# Patient Record
Sex: Female | Born: 1995 | Race: Black or African American | Hispanic: No | Marital: Single | Smoking: Never smoker
Health system: Southern US, Community
[De-identification: ages and names within clinical notes are randomized; demographics above are authoritative.]

## PROBLEM LIST (undated history)

## (undated) ENCOUNTER — Inpatient Hospital Stay (HOSPITAL_COMMUNITY): Payer: Self-pay

## (undated) DIAGNOSIS — D649 Anemia, unspecified: Secondary | ICD-10-CM

## (undated) HISTORY — PX: NO PAST SURGERIES: SHX2092

---

## 2013-04-22 ENCOUNTER — Encounter (HOSPITAL_COMMUNITY): Payer: Self-pay | Admitting: *Deleted

## 2013-04-22 ENCOUNTER — Emergency Department (HOSPITAL_COMMUNITY)
Admission: EM | Admit: 2013-04-22 | Discharge: 2013-04-22 | Disposition: A | Discharge status: Home / Self Care | Payer: Self-pay | Attending: Emergency Medicine | Admitting: Emergency Medicine

## 2013-04-22 ENCOUNTER — Encounter (HOSPITAL_COMMUNITY): Payer: Self-pay | Admitting: Emergency Medicine

## 2013-04-22 DIAGNOSIS — R200 Anesthesia of skin: Secondary | ICD-10-CM

## 2013-04-22 DIAGNOSIS — R5381 Other malaise: Secondary | ICD-10-CM | POA: Insufficient documentation

## 2013-04-22 DIAGNOSIS — F411 Generalized anxiety disorder: Secondary | ICD-10-CM | POA: Insufficient documentation

## 2013-04-22 DIAGNOSIS — Z79899 Other long term (current) drug therapy: Secondary | ICD-10-CM | POA: Insufficient documentation

## 2013-04-22 DIAGNOSIS — L819 Disorder of pigmentation, unspecified: Secondary | ICD-10-CM | POA: Insufficient documentation

## 2013-04-22 DIAGNOSIS — E669 Obesity, unspecified: Secondary | ICD-10-CM | POA: Insufficient documentation

## 2013-04-22 DIAGNOSIS — D649 Anemia, unspecified: Secondary | ICD-10-CM | POA: Insufficient documentation

## 2013-04-22 DIAGNOSIS — R0682 Tachypnea, not elsewhere classified: Secondary | ICD-10-CM | POA: Insufficient documentation

## 2013-04-22 DIAGNOSIS — F419 Anxiety disorder, unspecified: Secondary | ICD-10-CM

## 2013-04-22 DIAGNOSIS — I7389 Other specified peripheral vascular diseases: Secondary | ICD-10-CM | POA: Insufficient documentation

## 2013-04-22 DIAGNOSIS — R209 Unspecified disturbances of skin sensation: Secondary | ICD-10-CM | POA: Insufficient documentation

## 2013-04-22 DIAGNOSIS — M7989 Other specified soft tissue disorders: Secondary | ICD-10-CM | POA: Insufficient documentation

## 2013-04-22 DIAGNOSIS — R42 Dizziness and giddiness: Secondary | ICD-10-CM | POA: Insufficient documentation

## 2013-04-22 DIAGNOSIS — R51 Headache: Secondary | ICD-10-CM | POA: Insufficient documentation

## 2013-04-22 HISTORY — DX: Anemia, unspecified: D64.9

## 2013-04-22 LAB — POCT I-STAT, CHEM 8
Calcium, Ion: 1.18 mmol/L (ref 1.12–1.23)
Glucose, Bld: 102 mg/dL — ABNORMAL HIGH (ref 70–99)
HCT: 37 % (ref 36.0–46.0)
Hemoglobin: 12.6 g/dL (ref 12.0–15.0)

## 2013-04-22 MED ORDER — LORAZEPAM 1 MG PO TABS: 1.0000 mg | ORAL_TABLET | Freq: Four times a day (QID) | ORAL | Status: DC | PRN

## 2013-04-22 MED ORDER — LORAZEPAM 2 MG/ML IJ SOLN
1.0000 mg | Freq: Once | INTRAMUSCULAR | Status: AC
  Administered 2013-04-22: 1 mg via INTRAMUSCULAR
  Filled 2013-04-22: qty 1

## 2013-04-22 NOTE — ED Notes (Signed)
PT. REPORTS RIGHT HAND Nathaniel Man PAIN /NUMBNESS ONSET THIS EVENING , DENIES INJURY .

## 2013-04-22 NOTE — ED Notes (Signed)
Pt in c/o bilateral hand pain, states she was seen here for same last night and dx with an anxiety attack, states she was walking to class and started to feel her heart beating in her arms and her hand is clamped down and her hands were turning blue, describes and tingling feeling, no distress noted.

## 2013-04-22 NOTE — ED Notes (Signed)
Pt reports bilateral swelling, tingling, numbness in both hands since yesterday. Reports coming to the ER last night for the same complaint and was diagnosed with anxiety but does not think such is the case. States that she has migraines and did have a headache with the numbness and tingling. Came back to the ER for further evaluation.

## 2013-04-22 NOTE — ED Provider Notes (Signed)
17 year old female comes in with numbness of her right arm which occurred after she came out of the shower this evening., She is tearful and appears to be hyperventilating. She has decreased sensation over the right thumb, second finger, third finger, fifth finger. She will make a pincer grasp with slight weakness but she will not flex her third, fourth, fifth fingers. Tinel sign is negative. She has good capillary refill and strong pulses. It is possible that she has carpal tunnel syndrome but I think more likely she is hyperventilating. She will be given a dose of lorazepam and reassessed.  Medical screening examination/treatment/procedure(s) were conducted as a shared visit with non-physician practitioner(s) and myself.  I personally evaluated the patient during the encounter   Dione Booze, MD 04/22/13 [phone]

## 2013-04-22 NOTE — ED Provider Notes (Signed)
CSN: [id number]     Arrival date & time 04/22/13  1501 History  This chart was scribed for Dierdre Forth, PA working with Shon Baton, MD by Quintella Reichert, ED Scribe. This patient was seen in room TR06C/TR06C and the patient's care was started at 3:43 PM.    Chief Complaint  Patient presents with  . Hand Pain    The history is provided by the patient. No language interpreter was used.    HPI Comments: Lisa Garza is a 17 y.o. female with h/o anemia who presents to the Emergency Department complaining of several hours of bilateral hand and arm numbness, with associated swelling, blue discoloration to bilateral palms, and posterior headache.  Pt states that she was in class when her right hand suddenly became numb and began swelling and her palm turned blue.  She then developed numbness and swelling to her entire right arm.  These same symptoms then occurred to her left hand and then her left arm.  She also complains of pain to the arms and hands described as tightness and states "I could feel my heart beating through my arms."  She denies CP or SOB.  Presently she states her arm swelling has resolved but her hands are still somewhat swollen and her palms are blue.  Pt was seen in the ED yesterday for an episode of the same symptoms and informed that her symptoms may be associated with hyperventilation due to anxiety.  She was given a dose of lorazepam and states that afterwards all of her symptoms returned to normal, including her palm discoloration.  She denies similar episodes prior to yesterday.  She denies any recent increased stress.  She states she is eating normally.  She denies any allergies to her knowledge.  Pt notes that she received a tattoo 3 days ago but she did not have any issues when it was placed, and she denies pain or itching to the tattoo.  Her friend also received a tattoo from the same shop on the same day and has not had any similar symptoms.  She reports a  past medical history of anemia requiring blood transfusion after her hemoglobin dropped down to 4 due to heavy menstrual bleeding.  She denies experiencing similar symptoms at that time.  Pt has no PCP   Past Medical History  Diagnosis Date  . Anemia     History reviewed. No pertinent past surgical history.   History reviewed. No pertinent family history.   History  Substance Use Topics  . Smoking status: Never Smoker   . Smokeless tobacco: Not on file  . Alcohol Use: No    OB History   Grav Para Term Preterm Abortions TAB SAB Ect Mult Living                   Review of Systems  Respiratory: Negative for shortness of breath.   Cardiovascular: Negative for chest pain.  Musculoskeletal:       Bilateral arm and hand swelling  Skin: Positive for color change.  Neurological: Positive for numbness and headaches.  All other systems reviewed and are negative.      Allergies  Review of patient's allergies indicates no known allergies.  Home Medications   Current Outpatient Rx  Name  Route  Sig  Dispense  Refill  . IRON PO   Oral   Take 1 tablet by mouth daily.         Marland Kitchen LORazepam (ATIVAN) 1 MG tablet  Oral   Take 1 tablet (1 mg total) by mouth every 6 (six) hours as needed for anxiety.   10 tablet   0    BP 153/92  Pulse 88  Temp(Src) 98.4 F (36.9 C) (Oral)  Resp 20  Wt 200 lb (90.719 kg)  BMI 30.42 kg/m2  SpO2 100%  LMP 04/18/2013  Physical Exam  Nursing note and vitals reviewed. Constitutional: She appears well-developed and well-nourished. No distress.  Awake, alert, nontoxic appearance  HENT:  Head: Normocephalic and atraumatic.  Mouth/Throat: Oropharynx is clear and moist. No oropharyngeal exudate.  Eyes: Conjunctivae are normal. No scleral icterus.  Neck: Normal range of motion. Neck supple.  Cardiovascular: Normal rate, regular rhythm, normal heart sounds and intact distal pulses.   No murmur heard. Capillary refill <3 seconds to  palms of hands Capillary refill < 3 seconds to soles of feet  Pulmonary/Chest: Effort normal and breath sounds normal. No stridor. Not tachypneic. No respiratory distress. She has no wheezes. She has no rales.  Breath sounds clear and equal  Abdominal: Soft. Bowel sounds are normal. She exhibits no mass. There is no tenderness. There is no rebound and no guarding.  Musculoskeletal: Normal range of motion. She exhibits no edema.       Right hand: She exhibits swelling (mild, generalized). She exhibits normal range of motion, no tenderness, no bony tenderness, normal two-point discrimination, normal capillary refill, no deformity and no laceration.       Left hand: She exhibits normal range of motion, no tenderness, no bony tenderness, normal two-point discrimination, normal capillary refill, no deformity, no laceration and no swelling. Normal sensation noted.       Right foot: She exhibits normal capillary refill.       Left foot: She exhibits normal capillary refill.  Neurological: She is alert.  Speech is clear and goal oriented, follows commands Major Cranial nerves without deficit, no facial droop Normal strength in upper and lower extremities bilaterally including dorsiflexion and plantar flexion, strong and equal grip strength Sensation normal to light and sharp touch Moves extremities without ataxia, coordination intact Normal finger to nose and rapid alternating movements Neg romberg, no pronator drift Normal gait and balance  Skin: Skin is warm and dry. She is not diaphoretic. There is cyanosis (bilateral palms). No erythema.  Palms of hand cyanotic Soles of feet are pink  Psychiatric: She has a normal mood and affect.    ED Course  Procedures (including critical care time)  DIAGNOSTIC STUDIES: Oxygen Saturation is 100% on room air, normal by my interpretation.    COORDINATION OF CARE: 3:49 PM-Discussed treatment plan which includes evaluation by attending physician with pt at  bedside and pt agreed to plan.   5:19 PM: Informed pt that symptoms are consistent with acrocyanosis.  Discussed treatment plan which includes referral to PCP for further evaluation and management with pt at bedside and pt agreed to plan.  Labs Review Labs Reviewed - No data to display  Imaging Review No results found.  MDM   1. Acrocyanosis    Lisa Garza presents with mild swelling and cyanosis the palms of both hands. She's without central cyanosis, shortness of breath, wheezing, hyperventilation or tachycardia.  Her oxygen saturation measured at 100% in spite of the cyanosis.  Patient history of anemia but i-STAT chemistry taken at 1 AM this morning at her previous visit is normal. She is without electrolyte abnormalities or anemia.  Patient symptoms last night completely resolved with Ativan however last  night it was documented that she was tearful, hyperventilating and tachycardic. Patient has none of the symptoms today with persistent cyanosis.  Patient's feet without cyanosis.  Patient with acrocyanosis of unknown etiology.  Patient alert, oriented, nontoxic, nonseptic appearing.  Does not appear classically to be a Raynaud's phenomenon as her hands are currently warm, nonpainful and there was no cold stimulus; but cannot rule this out.  Patient has no known medical problems and denies drug abuse.  There is no evidence of allergic reaction or cellulitis.  There is also no evidence of peripheral arterial or vascular disease this patient has strong radial pulses and a normal capillary refill.  It has been determined that no acute conditions requiring further emergency intervention are present at this time. The patient/guardian have been advised of the diagnosis and plan. We have discussed signs and symptoms that warrant return to the ED, such as changes or worsening in symptoms.   Vital signs are stable at discharge.   BP 153/92  Pulse 88  Temp(Src) 98.4 F (36.9 C) (Oral)  Resp  20  Wt 200 lb (90.719 kg)  BMI 30.42 kg/m2  SpO2 100%  LMP 04/18/2013  Patient/guardian has voiced understanding and agreed to follow-up with the PCP or specialist.  Dr. Wilkie Aye was consulted, evaluated this patient with me and agrees with the plan.    I personally performed the services described in this documentation, which was scribed in my presence. The recorded information has been reviewed and is accurate.    Dahlia Client Ezana Hubbert, PA-C 04/22/13 2341  Dierdre Forth, PA-C 04/22/13 [phone]

## 2013-04-22 NOTE — ED Provider Notes (Signed)
CSN: [id number]     Arrival date & time 04/22/13  0034 History   First MD Initiated Contact with Patient 04/22/13 0105     Chief Complaint  Patient presents with  . Hand Pain   (Consider location/radiation/quality/duration/timing/severity/associated sxs/prior Treatment) HPI Comments: Patient is a 17 year old female who presents today with Sudden onset of right hand numbness. She reports that she was walking to the bathroom and on 7 her entire right arm went numb. It is painful. This never happened to her in the past. She reports that since that time she has become lightheaded. She reports that her arms "tingling" and refuses to move her fingers. She reports past medical history of anemia requiring blood transfusion. She denies any illicit drug use or alcohol consumption. No fevers, chills, nausea, vomiting. She states she was feeling well before this occurred.  The history is provided by the patient. No language interpreter was used.    Past Medical History  Diagnosis Date  . Anemia    History reviewed. No pertinent past surgical history. No family history on file. History  Substance Use Topics  . Smoking status: Never Smoker   . Smokeless tobacco: Not on file  . Alcohol Use: No   OB History   Grav Para Term Preterm Abortions TAB SAB Ect Mult Living                 Review of Systems  Constitutional: Negative for fever and chills.  Respiratory: Negative for shortness of breath.   Neurological: Positive for weakness and numbness.  All other systems reviewed and are negative.    Allergies  Review of patient's allergies indicates no known allergies.  Home Medications   Current Outpatient Rx  Name  Route  Sig  Dispense  Refill  . IRON PO   Oral   Take 1 tablet by mouth daily.          BP 147/78  Pulse 105  Temp(Src) 98.3 F (36.8 C) (Oral)  Ht 5\' 8"  (1.727 m)  Wt 200 lb (90.719 kg)  BMI 30.42 kg/m2  SpO2 97%  LMP 04/18/2013 Physical Exam  Nursing note and  vitals reviewed. Constitutional: She is oriented to person, place, and time. She appears well-developed and well-nourished. No distress.  obese  HENT:  Head: Normocephalic and atraumatic.  Right Ear: External ear normal.  Left Ear: External ear normal.  Nose: Nose normal.  Mouth/Throat: Oropharynx is clear and moist.  Eyes: Conjunctivae are normal.  Neck: Normal range of motion.  Cardiovascular: Normal rate, regular rhythm and normal heart sounds.   Pulmonary/Chest: Breath sounds normal. No stridor. Tachypnea noted. No respiratory distress. She has no wheezes. She has no rales.  Patient is hyperventilating   Abdominal: Soft. She exhibits no distension.  Musculoskeletal: Normal range of motion.  Neurological: She is alert and oriented to person, place, and time. She has normal strength.  Sharp-dull sensation intact on right arm. On right palm patient reports no sensation. Grip strength decreased in right hand. Strength normal in triceps/biceps/shoulders when tested against resistance.   Skin: Skin is warm and dry. She is not diaphoretic. No erythema.  Psychiatric: Her behavior is normal. Her mood appears anxious.  tearful    ED Course  Procedures (including critical care time) Labs Review Labs Reviewed  POCT I-STAT, CHEM 8 - Abnormal; Notable for the following:    Glucose, Bld 102 (*)    All other components within normal limits   Imaging Review No results  found.  MDM   1. Anxiety   2. Numbness and tingling in hands     Patient presents with sudden onset of tingling in hands. Given Ativan IM. Patient re-evaluated and numbness improved, but patient complaining of headache. She continues to repeat she wants to go home. HA is non concerning for [facility], ICH, Meningitis, or temporal arteritis. I believe her symptoms are all stemming from her anxiety. Chem 8 is unremarkable. No anemia. Dr. Preston Fleeting evaluated patient and agrees with plan.     Mora Bellman, PA-C 04/22/13 [phone]

## 2013-04-22 NOTE — ED Provider Notes (Signed)
Medical screening examination/treatment/procedure(s) were conducted as a shared visit with non-physician practitioner(s) and myself.  I personally evaluated the patient during the encounter   This a 17 year old female who presents with bilateral hand pain, swelling, and discoloration. Patient was seen last night for similar complaints but in the setting of acute anxiety. Her symptoms resolved and she was discharged home. She had return of pain, tingling and bilateral hand discoloration. She denies any other symptoms at this time. Examination is notable for acrocyanosis of the bilateral hands. Refill is less than 3 seconds. Hands are warm, sensation and movement are intact. Patient denies any white discoloration of hands prior to the blue discoloration. She is not anxious appearing and is not hyperventilating at this time. Review of her labs done last night including calcium are normal. At this time unsure what is causing her acrocyanosis. She could be developing Raynaud's phenomenon. She will be discharged home and set up for PCP followup for further management.  Shon Baton, MD 04/22/13 [phone]

## 2013-04-23 NOTE — ED Provider Notes (Signed)
Medical screening examination/treatment/procedure(s) were conducted as a shared visit with non-physician practitioner(s) and myself.  I personally evaluated the patient during the encounter  Shon Baton, MD 04/23/13 1313

## 2014-07-24 ENCOUNTER — Emergency Department (HOSPITAL_COMMUNITY)
Admission: EM | Admit: 2014-07-24 | Discharge: 2014-07-24 | Disposition: A | Discharge status: Home / Self Care | Payer: Self-pay | Attending: Emergency Medicine | Admitting: Emergency Medicine

## 2014-07-24 ENCOUNTER — Encounter (HOSPITAL_COMMUNITY): Payer: Self-pay | Admitting: *Deleted

## 2014-07-24 DIAGNOSIS — Z91040 Latex allergy status: Secondary | ICD-10-CM | POA: Insufficient documentation

## 2014-07-24 DIAGNOSIS — Z79899 Other long term (current) drug therapy: Secondary | ICD-10-CM | POA: Insufficient documentation

## 2014-07-24 DIAGNOSIS — Z789 Other specified health status: Secondary | ICD-10-CM

## 2014-07-24 DIAGNOSIS — D649 Anemia, unspecified: Secondary | ICD-10-CM | POA: Insufficient documentation

## 2014-07-24 DIAGNOSIS — N644 Mastodynia: Secondary | ICD-10-CM

## 2014-07-24 DIAGNOSIS — M795 Residual foreign body in soft tissue: Secondary | ICD-10-CM | POA: Insufficient documentation

## 2014-07-24 DIAGNOSIS — N649 Disorder of breast, unspecified: Secondary | ICD-10-CM | POA: Insufficient documentation

## 2014-07-24 MED ORDER — CEPHALEXIN 500 MG PO CAPS
500.0000 mg | ORAL_CAPSULE | Freq: Four times a day (QID) | ORAL | Status: DC
Start: 2014-07-24 — End: 2014-07-24

## 2014-07-24 MED ORDER — LORAZEPAM 1 MG PO TABS: 1.0000 mg | ORAL_TABLET | Freq: Four times a day (QID) | ORAL | Status: DC | PRN

## 2014-07-24 MED ORDER — ACETAMINOPHEN-CODEINE #3 300-30 MG PO TABS: 1.0000 | ORAL_TABLET | ORAL | Status: DC | PRN

## 2014-07-24 MED ORDER — CEPHALEXIN 500 MG PO CAPS: 500.0000 mg | ORAL_CAPSULE | Freq: Four times a day (QID) | ORAL | Status: DC

## 2014-07-24 NOTE — ED Notes (Signed)
Declined W/C at D/C and was escorted to lobby by RN. 

## 2014-07-24 NOTE — ED Notes (Signed)
Pt in c/o foreign body to bilateral nipples. Pt got them pierced with bars, on each end of the bars there is a ball to hold them in place. On each side, one of the balls has been pulled into her nipple tissue. The other ball of each, remains on the outside. Pt returned to shop where she had this done and they were unable to assist her. Pt c/o pain, denies drainage from areas. No distress noted.

## 2014-07-24 NOTE — Discharge Instructions (Signed)
1. Medications: Tylenol #3, usual home medications 2. Treatment: rest, drink plenty of fluids, warm compresses 3. Follow Up: Please followup with your primary doctor and/or general surgery in 3 days for discussion of your diagnoses and further evaluation after today's visit; if you do not have a primary care doctor use the resource guide provided to find one; Please return to the ER for signs of infection including purulent drainage, redness or increased warmth.

## 2014-07-24 NOTE — ED Provider Notes (Signed)
CSN: [id number]     Arrival date & time 07/24/14  1613 History  This chart was scribed for non-physician practitioner, Dierdre ForthHannah Cherylene Ferrufino, PA-C,working with Mirian MoMatthew Gentry, MD, by Karle PlumberJennifer Tensley, ED Scribe. This patient was seen in room TR09C/TR09C and the patient's care was started at 5:01 PM.  Chief Complaint  Patient presents with  . Foreign Body   HPI  HPI Comments:  Lisa Garza is a 18 y.o. obese female who presents to the Emergency Department complaining of her nipple piercing being stuck in her nipple. She reports she got her nipple pierced more than 6 months ago without any issue but states that yesterday the ball of the piercing went inside the hole. She went to the person that placed the piercing PTA but he could not remove it due to the swelling. She reports associated severe pain and clear drainage from the site. Touching the area makes the pain worse. Denies alleviating factors. She has not taken anything for the pain or done anything to treat the symptoms. Denies fever or chills.  No history of diabetes or immunocompromise.  Past Medical History  Diagnosis Date  . Anemia    History reviewed. No pertinent past surgical history. History reviewed. No pertinent family history. History  Substance Use Topics  . Smoking status: Never Smoker   . Smokeless tobacco: Not on file  . Alcohol Use: No   OB History    No data available     Review of Systems  Constitutional: Negative for fever, chills, diaphoresis, appetite change, fatigue and unexpected weight change.  HENT: Negative for mouth sores.   Eyes: Negative for visual disturbance.  Respiratory: Negative for cough, chest tightness, shortness of breath and wheezing.   Cardiovascular: Negative for chest pain.  Gastrointestinal: Negative for nausea, vomiting, abdominal pain, diarrhea and constipation.  Endocrine: Negative for polydipsia, polyphagia and polyuria.  Genitourinary: Negative for dysuria, urgency, frequency  and hematuria.  Musculoskeletal: Negative for back pain and neck stiffness.  Skin: Positive for wound. Negative for rash.       Foreign body in left nipple  Allergic/Immunologic: Negative for immunocompromised state.  Neurological: Negative for syncope, light-headedness and headaches.  Hematological: Does not bruise/bleed easily.  Psychiatric/Behavioral: Negative for sleep disturbance. The patient is not nervous/anxious.     Allergies  Latex  Home Medications   Prior to Admission medications   Medication Sig Start Date End Date Taking? Authorizing Provider  ferrous sulfate 325 (65 FE) MG tablet Take 325 mg by mouth daily with breakfast. For anemia   Yes Historical Provider, MD  acetaminophen-codeine (TYLENOL #3) 300-30 MG per tablet Take 1 tablet by mouth every 4 (four) hours as needed for moderate pain. 07/24/14   Nikitha Mode, PA-C  cephALEXin (KEFLEX) 500 MG capsule Take 1 capsule (500 mg total) by mouth 4 (four) times daily. 07/24/14   Duvan Mousel, PA-C   Triage Vitals: BP 108/58 mmHg  Pulse 89  Temp(Src) 98.3 F (36.8 C) (Oral)  Resp 18  Ht 5\' 8"  (1.727 m)  Wt 190 lb (86.183 kg)  BMI 28.90 kg/m2  SpO2 95% Physical Exam  Constitutional: She appears well-developed and well-nourished. No distress.  Awake, alert, nontoxic appearance  HENT:  Head: Normocephalic and atraumatic.  Mouth/Throat: Oropharynx is clear and moist. No oropharyngeal exudate.  Eyes: Conjunctivae are normal. No scleral icterus.  Neck: Normal range of motion. Neck supple.  Cardiovascular: Normal rate, regular rhythm and intact distal pulses.   Pulmonary/Chest: Effort normal and breath sounds normal. No respiratory  distress. She has no wheezes.  Equal chest expansion.  Bilateral nipple piercings in place with barbell shaped piercings bilaterally. Left ball has become imbedded in nipple and piercing hole has closed over. Areola and nipple are soft, nonerythematous and without induration or  increased warmth. No purulent drainage noted to either nipple or piercing. No other changes in skin of breasts bilaterally.  Abdominal: Soft. Bowel sounds are normal. She exhibits no mass. There is no tenderness. There is no rebound and no guarding.  Musculoskeletal: Normal range of motion. She exhibits no edema.  Neurological: She is alert.  Speech is clear and goal oriented Moves extremities without ataxia  Skin: Skin is warm and dry. She is not diaphoretic.  Psychiatric: She has a normal mood and affect.  Nursing note and vitals reviewed.   ED Course  Procedures (including critical care time) DIAGNOSTIC STUDIES: Oxygen Saturation is 95% on RA, adequate by my interpretation.   COORDINATION OF CARE: 5:06 PM- Will prescribe pain medication and refer to surgeon. Pt verbalizes understanding and agrees to plan.  Medications - No data to display  Labs Review Labs Reviewed - No data to display  Imaging Review No results found.   EKG Interpretation None      MDM   Final diagnoses:  Body piercing  Nipple pain   Lisa LimeSumarra Stege resents with bilateral nipple pain due to ingrown nipple rings. Breast exam without evidence of masses, erythema, induration appeared on drainage to suggest infection or abscess. Nipple rings will need to be removed however I believe this is better left for a general surgeon.  Patient will be given pain control and Keflex to prevent infection. She is to see her primary care doctor and/or general surgeon within 3 days for further evaluation.  I have personally reviewed patient's vitals, nursing note and any pertinent labs or imaging.  I performed an focused physical exam; undressed when appropriate .    It has been determined that no acute conditions requiring further emergency intervention are present at this time. The patient/guardian have been advised of the diagnosis and plan. I reviewed any labs and imaging including any potential incidental findings. We  have discussed signs and symptoms that warrant return to the ED and they are listed in the discharge instructions.    Vital signs are stable at discharge.   BP 108/58 mmHg  Pulse 89  Temp(Src) 98.3 F (36.8 C) (Oral)  Resp 18  Ht 5\' 8"  (1.727 m)  Wt 190 lb (86.183 kg)  BMI 28.90 kg/m2  SpO2 95%  I personally performed the services described in this documentation, which was scribed in my presence. The recorded information has been reviewed and is accurate.    Lisa ClientHannah Jackee Glasner, PA-C 07/24/14 1805  Mirian MoMatthew Gentry, MD 07/27/14 [phone]

## 2014-09-28 ENCOUNTER — Emergency Department (HOSPITAL_COMMUNITY)
Admission: EM | Admit: 2014-09-28 | Discharge: 2014-09-28 | Disposition: A | Discharge status: Home / Self Care | Payer: PRIVATE HEALTH INSURANCE | Attending: Emergency Medicine | Admitting: Emergency Medicine

## 2014-09-28 ENCOUNTER — Encounter (HOSPITAL_COMMUNITY): Payer: Self-pay | Admitting: Physical Medicine and Rehabilitation

## 2014-09-28 DIAGNOSIS — H109 Unspecified conjunctivitis: Secondary | ICD-10-CM

## 2014-09-28 DIAGNOSIS — Z91040 Latex allergy status: Secondary | ICD-10-CM | POA: Diagnosis not present

## 2014-09-28 DIAGNOSIS — Z79899 Other long term (current) drug therapy: Secondary | ICD-10-CM | POA: Diagnosis not present

## 2014-09-28 DIAGNOSIS — D649 Anemia, unspecified: Secondary | ICD-10-CM | POA: Insufficient documentation

## 2014-09-28 DIAGNOSIS — Z792 Long term (current) use of antibiotics: Secondary | ICD-10-CM | POA: Insufficient documentation

## 2014-09-28 DIAGNOSIS — H5712 Ocular pain, left eye: Secondary | ICD-10-CM | POA: Diagnosis present

## 2014-09-28 MED ORDER — TETRACAINE HCL 0.5 % OP SOLN
1.0000 [drp] | Freq: Once | OPHTHALMIC | Status: AC
  Administered 2014-09-28: 1 [drp] via OPHTHALMIC
  Filled 2014-09-28: qty 2

## 2014-09-28 MED ORDER — FLUORESCEIN SODIUM 1 MG OP STRP
1.0000 | ORAL_STRIP | Freq: Once | OPHTHALMIC | Status: AC
  Administered 2014-09-28: 1 via OPHTHALMIC
  Filled 2014-09-28: qty 1

## 2014-09-28 MED ORDER — POLYMYXIN B-TRIMETHOPRIM 10000-0.1 UNIT/ML-% OP SOLN: 2.0000 [drp] | OPHTHALMIC | Status: DC

## 2014-09-28 NOTE — ED Notes (Signed)
Pt presents to department for evaluation of L eye pain/redness. Ongoing x1 day.

## 2014-09-28 NOTE — Discharge Instructions (Signed)
Use antibiotic eyedrops as directed until symptoms resolve. Refer to attached documents for more information. °

## 2014-09-28 NOTE — ED Provider Notes (Signed)
CSN: [id number]     Arrival date & time 09/28/14  1046 History  This chart was scribed for non-physician practitioner Emilia BeckKaitlyn Fabien Travelstead, working with Lyanne CoKevin M Campos, MD by Carl Bestelina Holson, ED Scribe. This patient was seen in room TR04C/TR04C and the patient's care was started at 11:55 AM.   Chief Complaint  Patient presents with  . Eye Pain   HPI Comments: Lisa Garza is a 19 y.o. female who presents to the Emergency Department complaining of left eye pain for 1 day. The pain is aching and severe without radiation. The pain is in the left eye. No injury or foreign body. Patient wears contacts. She reports associated eye redness and watery discharge. Nothing tried at home.    Patient is a 19 y.o. female presenting with eye pain. The history is provided by the patient. No language interpreter was used.  Eye Pain    Past Medical History  Diagnosis Date  . Anemia    History reviewed. No pertinent past surgical history. No family history on file. History  Substance Use Topics  . Smoking status: Never Smoker   . Smokeless tobacco: Not on file  . Alcohol Use: No   OB History    No data available     Review of Systems  Eyes: Positive for pain and redness.  All other systems reviewed and are negative.     Allergies  Latex  Home Medications   Prior to Admission medications   Medication Sig Start Date End Date Taking? Authorizing Provider  acetaminophen-codeine (TYLENOL #3) 300-30 MG per tablet Take 1 tablet by mouth every 4 (four) hours as needed for moderate pain. 07/24/14   Hannah Muthersbaugh, PA-C  cephALEXin (KEFLEX) 500 MG capsule Take 1 capsule (500 mg total) by mouth 4 (four) times daily. 07/24/14   Hannah Muthersbaugh, PA-C  ferrous sulfate 325 (65 FE) MG tablet Take 325 mg by mouth daily with breakfast. For anemia    Historical Provider, MD   BP 125/69 mmHg  Pulse 66  Temp(Src) 97.6 F (36.4 C) (Oral)  Resp 18  SpO2 100% Physical Exam  Constitutional: She is  oriented to person, place, and time. She appears well-developed and well-nourished.  HENT:  Head: Normocephalic and atraumatic.  Eyes: EOM are normal. Pupils are equal, round, and reactive to light.  Left conjunctival injection. Fluorescin dye exam unremarkable for abrasion, ulceration or foreign body.   Neck: Normal range of motion.  Cardiovascular: Normal rate.   Pulmonary/Chest: Effort normal.  Abdominal: Soft. She exhibits no distension. There is no tenderness.  Musculoskeletal: Normal range of motion.  Neurological: She is alert and oriented to person, place, and time. Coordination normal.  Skin: Skin is warm and dry.  Psychiatric: She has a normal mood and affect. Her behavior is normal.  Nursing note and vitals reviewed.   ED Course  Procedures (including critical care time)  DIAGNOSTIC STUDIES: Oxygen Saturation is 100% on room air, normal by my interpretation.    COORDINATION OF CARE:    Labs Review Labs Reviewed - No data to display  Imaging Review No results found.   EKG Interpretation None      MDM   Final diagnoses:  Conjunctivitis of left eye    1:18 PM Patient's fluorescin dye exam unremarkable for abrasion, ulceration, or foreign body. Patient likely has conjunctivitis. Vitals stable and patient afebrile.   I personally performed the services described in this documentation, which was scribed in my presence. The recorded information has been reviewed and  is accurate.    Emilia Beck, PA-C 09/28/14 1321  Lyanne Co, MD 09/28/14 1550

## 2014-09-30 ENCOUNTER — Emergency Department (HOSPITAL_COMMUNITY)
Admission: EM | Admit: 2014-09-30 | Discharge: 2014-09-30 | Disposition: A | Discharge status: Home / Self Care | Payer: 59 | Attending: Emergency Medicine | Admitting: Emergency Medicine

## 2014-09-30 ENCOUNTER — Encounter (HOSPITAL_COMMUNITY): Payer: Self-pay

## 2014-09-30 DIAGNOSIS — M542 Cervicalgia: Secondary | ICD-10-CM | POA: Diagnosis not present

## 2014-09-30 DIAGNOSIS — Z882 Allergy status to sulfonamides status: Secondary | ICD-10-CM | POA: Diagnosis not present

## 2014-09-30 DIAGNOSIS — H11422 Conjunctival edema, left eye: Secondary | ICD-10-CM | POA: Diagnosis not present

## 2014-09-30 DIAGNOSIS — Z79899 Other long term (current) drug therapy: Secondary | ICD-10-CM | POA: Insufficient documentation

## 2014-09-30 DIAGNOSIS — Z91040 Latex allergy status: Secondary | ICD-10-CM | POA: Diagnosis not present

## 2014-09-30 DIAGNOSIS — Z862 Personal history of diseases of the blood and blood-forming organs and certain disorders involving the immune mechanism: Secondary | ICD-10-CM | POA: Diagnosis not present

## 2014-09-30 DIAGNOSIS — H109 Unspecified conjunctivitis: Secondary | ICD-10-CM | POA: Diagnosis not present

## 2014-09-30 DIAGNOSIS — R21 Rash and other nonspecific skin eruption: Secondary | ICD-10-CM | POA: Diagnosis not present

## 2014-09-30 DIAGNOSIS — J029 Acute pharyngitis, unspecified: Secondary | ICD-10-CM | POA: Diagnosis not present

## 2014-09-30 DIAGNOSIS — Z792 Long term (current) use of antibiotics: Secondary | ICD-10-CM | POA: Insufficient documentation

## 2014-09-30 DIAGNOSIS — H578 Other specified disorders of eye and adnexa: Secondary | ICD-10-CM | POA: Diagnosis present

## 2014-09-30 LAB — RAPID STREP SCREEN (MED CTR MEBANE ONLY): STREPTOCOCCUS, GROUP A SCREEN (DIRECT): NEGATIVE

## 2014-09-30 MED ORDER — TOBRAMYCIN 0.3 % OP SOLN
2.0000 [drp] | OPHTHALMIC | Status: DC
  Administered 2014-09-30: 2 [drp] via OPHTHALMIC
  Filled 2014-09-30: qty 5

## 2014-09-30 MED ORDER — IBUPROFEN 400 MG PO TABS
800.0000 mg | ORAL_TABLET | Freq: Once | ORAL | Status: AC
  Administered 2014-09-30: 800 mg via ORAL
  Filled 2014-09-30: qty 2

## 2014-09-30 MED ORDER — FLUORESCEIN SODIUM 1 MG OP STRP
2.0000 | ORAL_STRIP | Freq: Once | OPHTHALMIC | Status: AC
  Administered 2014-09-30: 2 via OPHTHALMIC
  Filled 2014-09-30: qty 2

## 2014-09-30 MED ORDER — TETRACAINE HCL 0.5 % OP SOLN
1.0000 [drp] | Freq: Once | OPHTHALMIC | Status: AC
  Administered 2014-09-30: 1 [drp] via OPHTHALMIC
  Filled 2014-09-30: qty 2

## 2014-09-30 MED ORDER — DIPHENHYDRAMINE HCL 25 MG PO CAPS
25.0000 mg | ORAL_CAPSULE | Freq: Once | ORAL | Status: AC
  Administered 2014-09-30: 25 mg via ORAL
  Filled 2014-09-30: qty 1

## 2014-09-30 NOTE — ED Notes (Signed)
Tonopen and woods lamp at bedside 

## 2014-09-30 NOTE — ED Provider Notes (Signed)
CSN: [id number]     Arrival date & time 09/30/14  1407 History  This chart was scribed for non-physician practitioner, Dierdre Forth, PA-C, working with Richardean Canal, MD, by Bronson Curb, ED Scribe. This patient was seen in room TR04C/TR04C and the patient's care was started at 4:21 PM.   No chief complaint on file.   The history is provided by the patient and medical records. No language interpreter was used.     HPI Comments: Lisa Garza is a 19 y.o. female who presents to the Emergency Department complaining of worsening left eye pain and irritation for the past 2 days. Patient was seen here 2 days ago for the same and was informed her symptoms were related to conjunctivitis and prescribed Polymyxin B/ Trimethoprim for treat her symptoms. She reports she started the medication yesterday and subsequently broke out in a rash to her upper chest. There is associated left eye redness, burning, greenish discharge, subjective fever/chills, sore throat, generalized facial pain, and neck pain. She reports taking ibuprofen for the pain, last dose yesterday. Patient has known allergies to latex, but is unsure of any other triggers. She denies exposure to new lotions, soaps, detergents, chemicals. Patient wears contacts, but is not wearing them at this time. Patient last ate yesterday. She denies blurred vision, headache, photophobia, cough or congestion.   Past Medical History  Diagnosis Date  . Anemia    History reviewed. No pertinent past surgical history. History reviewed. No pertinent family history. History  Substance Use Topics  . Smoking status: Never Smoker   . Smokeless tobacco: Not on file  . Alcohol Use: No   OB History    No data available     Review of Systems  Constitutional: Positive for fever (subjective) and chills. Negative for diaphoresis, appetite change, fatigue and unexpected weight change.  HENT: Positive for sore throat. Negative for congestion and mouth  sores.   Eyes: Positive for pain (left eye), discharge (left eye) and redness (left eye). Negative for photophobia and visual disturbance.  Respiratory: Negative for cough, chest tightness, shortness of breath and wheezing.   Cardiovascular: Negative for chest pain.  Gastrointestinal: Negative for nausea, vomiting, abdominal pain, diarrhea and constipation.  Endocrine: Negative for polydipsia, polyphagia and polyuria.  Genitourinary: Negative for dysuria, urgency, frequency and hematuria.  Musculoskeletal: Positive for neck pain. Negative for back pain and neck stiffness.  Skin: Positive for rash.  Allergic/Immunologic: Negative for immunocompromised state.  Neurological: Negative for syncope, light-headedness and headaches.  Hematological: Does not bruise/bleed easily.  Psychiatric/Behavioral: Negative for sleep disturbance. The patient is not nervous/anxious.       Allergies  Latex and Sulfa antibiotics  Home Medications   Prior to Admission medications   Medication Sig Start Date End Date Taking? Authorizing Provider  acetaminophen-codeine (TYLENOL #3) 300-30 MG per tablet Take 1 tablet by mouth every 4 (four) hours as needed for moderate pain. 07/24/14   Mostafa Yuan, PA-C  cephALEXin (KEFLEX) 500 MG capsule Take 1 capsule (500 mg total) by mouth 4 (four) times daily. 07/24/14   Charles Andringa, PA-C  ferrous sulfate 325 (65 FE) MG tablet Take 325 mg by mouth daily with breakfast. For anemia    Historical Provider, MD  trimethoprim-polymyxin b (POLYTRIM) ophthalmic solution Place 2 drops into the left eye every 4 (four) hours. 09/28/14   Emilia Beck, PA-C   Triage Vitals: BP 160/98 mmHg  Pulse 65  Temp(Src) 99.2 F (37.3 C) (Oral)  Resp 18  SpO2 98%  Physical Exam  Constitutional: She is oriented to person, place, and time. She appears well-developed and well-nourished. No distress.  HENT:  Head: Normocephalic and atraumatic.  Right Ear: Tympanic membrane,  external ear and ear canal normal.  Left Ear: Tympanic membrane, external ear and ear canal normal.  Nose: Nose normal. No mucosal edema or rhinorrhea.  Mouth/Throat: Uvula is midline and mucous membranes are normal. Mucous membranes are not dry. No trismus in the jaw. No uvula swelling. Oropharyngeal exudate (right), posterior oropharyngeal edema and posterior oropharyngeal erythema present. No tonsillar abscesses.  Posterior oropharynx with erythema and edema bilaterally with exudate on the right tonsil without unilateral swelling, voice change or uvula deviation  Eyes: EOM are normal. Pupils are equal, round, and reactive to light. Lids are everted and swept, no foreign bodies found. Right eye exhibits no chemosis, no discharge and no exudate. No foreign body present in the right eye. Left eye exhibits chemosis and discharge. Left eye exhibits no exudate. No foreign body present in the left eye. Right conjunctiva is not injected. Right conjunctiva has no hemorrhage. Left conjunctiva is injected. Left conjunctiva has no hemorrhage.  Slit lamp exam:      The right eye shows no corneal abrasion, no corneal flare, no corneal ulcer, no foreign body, no fluorescein uptake and no anterior chamber bulge.       The left eye shows no corneal abrasion, no corneal flare, no corneal ulcer, no foreign body, no fluorescein uptake and no anterior chamber bulge.  Pupils equal round and reactive to light No vertical, horizontal or rotational nystagmus No Corneal abrasion  No fluorescein uptake No visible foreign body No corneal flare, ulcer or dendritic staining  No herpetic lesions to the face or around the eye No pain with EOMs Obvious chemosis noted the sclera of the left eye. Mild edema of the upper and lower lid  Visual Acuity (pt does not bring her glasses):  Bilateral Near: 20/100 ; Bilateral Distance: Unable to visualize ; R Near: 20/70 ; R Distance: 20/200 ; L Near: 20/200 ; L Distance: Unable to  visualize  Neck: Normal range of motion, full passive range of motion without pain and phonation normal. No tracheal tenderness, no spinous process tenderness and no muscular tenderness present. No rigidity. No erythema and normal range of motion present. No Brudzinski's sign and no Kernig's sign noted.  Full range of motion without pain No midline or paraspinal tenderness No nuchal rigidity; no meningeal signs Normal phonation Handling secretions without difficulty  Cardiovascular: Normal rate, regular rhythm, normal heart sounds and intact distal pulses.   Pulses:      Radial pulses are 2+ on the right side, and 2+ on the left side.  Pulmonary/Chest: Effort normal and breath sounds normal. No stridor. No respiratory distress. She has no decreased breath sounds. She has no wheezes.  Equal chest expansion, clear and equal breath sounds without focal wheezes, rhonchi or rales  Musculoskeletal: Normal range of motion.  Lymphadenopathy:       Head (right side): Submandibular and tonsillar adenopathy present. No submental, no preauricular, no posterior auricular and no occipital adenopathy present.       Head (left side): Submandibular and tonsillar adenopathy present. No submental, no preauricular, no posterior auricular and no occipital adenopathy present.    She has cervical adenopathy.       Right cervical: Superficial cervical adenopathy present. No deep cervical and no posterior cervical adenopathy present.      Left cervical: Superficial cervical  adenopathy present. No deep cervical and no posterior cervical adenopathy present.  Neurological: She is alert and oriented to person, place, and time.  Mental Status:  Alert, oriented, thought content appropriate. Speech fluent without evidence of aphasia. Able to follow 2 step commands without difficulty.   Cranial Nerves:  II:  Peripheral visual fields grossly normal, pupils equal, round, reactive to light III,IV, VI: ptosis not present,  extra-ocular motions intact bilaterally  V,VII: smile symmetric, facial light touch sensation equal VIII: hearing grossly normal bilaterally  IX,X: gag reflex present  XI: bilateral shoulder shrug equal and strong XII: midline tongue extension   Skin: Skin is warm and dry. She is not diaphoretic. No erythema.  Small erythematous papular rash located on the anterior chest  Psychiatric: She has a normal mood and affect.  Nursing note and vitals reviewed.   ED Course  Procedures (including critical care time)  DIAGNOSTIC STUDIES: Oxygen Saturation is 98% on room air, normal by my interpretation.    COORDINATION OF CARE: At 1633 Discussed treatment plan with patient which includes strep screen, Benadryl, and ibuprofen. Patient agrees.   Labs Review Labs Reviewed  RAPID STREP SCREEN  CULTURE, GROUP A STREP    Imaging Review No results found.   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  Pt reports being diagnosed with conjunctivitis several days ago.  She began the polymyxin/trimethoprin drop yesterday rapidly progressing burning of the anterior eye with gradual swelling of the lids.  Pt without known allergy to sulfa.  Pt also with erythematous rash to the anterior chest and sore throat.  Symptoms consistent with localized allergy to the left eye.  No deep aching pain.    Nursing note reports that pt c/o spontaneous abortion x 5 days ago, reports continued vaginal bleeding and reports increased lower abdominal pain. Pt reports to triage RN that she does not want to be evaluated for this and does not complain of this to me.    Pt significantly improved with benadryl, decreased swelling of the left eye and improvement of rash.    Patient presentation consistent with persistent bacterial conjunctivitis.   Purulent discharge on exam.  No corneal abrasions, entrapment, consensual photophobia, or dendritic staining with fluorescein study.  Presentation non-concerning for iritis, corneal abrasions, or HSV.  Patient will be given trobamycin ophthalmic because she is a contact lens wearer.  Personal hygiene and frequent handwashing discussed.  Patient advised to followup with ophthalmologist TOMORROW for reevaluation in several days..  Patient verbalizes understanding and is agreeable with discharge.  I have personally reviewed patient's vitals, nursing note and any pertinent labs or imaging.  I performed an undressed physical exam.    It has been determined that no acute conditions requiring further emergency intervention are present at this time. The patient/guardian have been advised of the diagnosis and plan. I reviewed all labs and imaging including any potential incidental findings. We have discussed signs and symptoms that warrant return to the ED and they are listed in the discharge instructions.    Vital signs are stable at discharge.   BP 131/71 mmHg  Pulse 65  Temp(Src) 99.1 F (37.3 C) (Oral)  Resp 16  SpO2 100%           Dierdre Forth, PA-C 09/30/14 1948  Richardean Canal,  MD 09/30/14 2338

## 2014-09-30 NOTE — ED Notes (Signed)
Pt at desk stating she only wants to be evaluated for her eye symptoms, does not wish to be seen for abdominal symptoms and is requesting to be seen in fast track. PA Laveda Normanran spoken with about this and is agreement to evaluate patient for her eye symptoms since that is all she is wanting to be seen for at this time.

## 2014-09-30 NOTE — ED Notes (Addendum)
Pt presents with worsening symptoms to L eye.  Pt seen here for same on Tuesday, given drops that she filled yesterday.  Pt reports drops burned her eye, she began to have rash to chest with pain in her cheeks and into her neck.  Pt also reports spontaneous abortion x 5 days ago, reports continued vaginal bleeding and reports increased lower abdominal pain.

## 2014-09-30 NOTE — Discharge Instructions (Signed)
1. Medications: tobradex 2 drops every 4 hours for 5 days, benadryl  every 6 hours for 24 hours, usual home medications 2. Treatment: rest, drink plenty of fluids,  3. Follow Up: Please followup with Ophthalmology tomorrow for discussion of your diagnoses and further evaluation after today's visit; if you do not have a primary care doctor use the resource guide provided to find one; Please return to the ER for worsening pain, decreasing vision, fevers or other concerns   Bacterial Conjunctivitis Bacterial conjunctivitis, commonly called pink eye, is an inflammation of the clear membrane that covers the white part of the eye (conjunctiva). The inflammation can also happen on the underside of the eyelids. The blood vessels in the conjunctiva become inflamed, causing the eye to become red or pink. Bacterial conjunctivitis may spread easily from one eye to another and from person to person (contagious).  CAUSES  Bacterial conjunctivitis is caused by bacteria. The bacteria may come from your own skin, your upper respiratory tract, or from someone else with bacterial conjunctivitis. SYMPTOMS  The normally white color of the eye or the underside of the eyelid is usually pink or red. The pink eye is usually associated with irritation, tearing, and some sensitivity to light. Bacterial conjunctivitis is often associated with a thick, yellowish discharge from the eye. The discharge may turn into a crust on the eyelids overnight, which causes your eyelids to stick together. If a discharge is present, there may also be some blurred vision in the affected eye. DIAGNOSIS  Bacterial conjunctivitis is diagnosed by your caregiver through an eye exam and the symptoms that you report. Your caregiver looks for changes in the surface tissues of your eyes, which may point to the specific type of conjunctivitis. A sample of any discharge may be collected on a cotton-tip swab if you have a severe case of conjunctivitis, if  your cornea is affected, or if you keep getting repeat infections that do not respond to treatment. The sample will be sent to a lab to see if the inflammation is caused by a bacterial infection and to see if the infection will respond to antibiotic medicines. TREATMENT   Bacterial conjunctivitis is treated with antibiotics. Antibiotic eyedrops are most often used. However, antibiotic ointments are also available. Antibiotics pills are sometimes used. Artificial tears or eye washes may ease discomfort. HOME CARE INSTRUCTIONS   To ease discomfort, apply a cool, clean washcloth to your eye for 10-20 minutes, 3-4 times a day.  Gently wipe away any drainage from your eye with a warm, wet washcloth or a cotton ball.  Wash your hands often with soap and water. Use paper towels to dry your hands.  Do not share towels or washcloths. This may spread the infection.  Change or wash your pillowcase every day.  You should not use eye makeup until the infection is gone.  Do not operate machinery or drive if your vision is blurred.  Stop using contact lenses. Ask your caregiver how to sterilize or replace your contacts before using them again. This depends on the type of contact lenses that you use.  When applying medicine to the infected eye, do not touch the edge of your eyelid with the eyedrop bottle or ointment tube. SEEK IMMEDIATE MEDICAL CARE IF:   Your infection has not improved within 3 days after beginning treatment.  You had yellow discharge from your eye and it returns.  You have increased eye pain.  Your eye redness is spreading.  Your vision becomes  blurred.  You have a fever or persistent symptoms for more than 2-3 days.  You have a fever and your symptoms suddenly get worse.  You have facial pain, redness, or swelling. MAKE SURE YOU:   Understand these instructions.  Will watch your condition.  Will get help right away if you are not doing well or get worse. Document  Released: 08/13/2005 Document Revised: 12/28/2013 Document Reviewed: 01/14/2012 [facility] Patient Information 2015 [employer], [place]. This information is not intended to replace advice given to you by your health care provider. Make sure you discuss any questions you have with your health care provider.

## 2014-10-02 LAB — CULTURE, GROUP A STREP

## 2014-10-21 ENCOUNTER — Emergency Department (HOSPITAL_COMMUNITY): Payer: PRIVATE HEALTH INSURANCE

## 2014-10-21 ENCOUNTER — Emergency Department (HOSPITAL_COMMUNITY)
Admission: EM | Admit: 2014-10-21 | Discharge: 2014-10-21 | Disposition: A | Discharge status: Home / Self Care | Payer: PRIVATE HEALTH INSURANCE | Attending: Emergency Medicine | Admitting: Emergency Medicine

## 2014-10-21 DIAGNOSIS — Y998 Other external cause status: Secondary | ICD-10-CM | POA: Diagnosis not present

## 2014-10-21 DIAGNOSIS — D649 Anemia, unspecified: Secondary | ICD-10-CM | POA: Insufficient documentation

## 2014-10-21 DIAGNOSIS — Y9389 Activity, other specified: Secondary | ICD-10-CM | POA: Diagnosis not present

## 2014-10-21 DIAGNOSIS — Z91040 Latex allergy status: Secondary | ICD-10-CM | POA: Insufficient documentation

## 2014-10-21 DIAGNOSIS — S59911A Unspecified injury of right forearm, initial encounter: Secondary | ICD-10-CM | POA: Insufficient documentation

## 2014-10-21 DIAGNOSIS — S6991XA Unspecified injury of right wrist, hand and finger(s), initial encounter: Secondary | ICD-10-CM | POA: Diagnosis not present

## 2014-10-21 DIAGNOSIS — Y92410 Unspecified street and highway as the place of occurrence of the external cause: Secondary | ICD-10-CM | POA: Insufficient documentation

## 2014-10-21 DIAGNOSIS — Z792 Long term (current) use of antibiotics: Secondary | ICD-10-CM | POA: Diagnosis not present

## 2014-10-21 DIAGNOSIS — Z79899 Other long term (current) drug therapy: Secondary | ICD-10-CM | POA: Insufficient documentation

## 2014-10-21 DIAGNOSIS — R200 Anesthesia of skin: Secondary | ICD-10-CM | POA: Diagnosis not present

## 2014-10-21 MED ORDER — IBUPROFEN 800 MG PO TABS: 800.0000 mg | ORAL_TABLET | Freq: Three times a day (TID) | ORAL | Status: DC | PRN

## 2014-10-21 MED ORDER — METHOCARBAMOL 500 MG PO TABS: 500.0000 mg | ORAL_TABLET | Freq: Two times a day (BID) | ORAL | Status: DC

## 2014-10-21 MED ORDER — IBUPROFEN 800 MG PO TABS
800.0000 mg | ORAL_TABLET | Freq: Once | ORAL | Status: AC
  Administered 2014-10-21: 800 mg via ORAL
  Filled 2014-10-21: qty 1
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
## 2014-10-21 NOTE — Discharge Instructions (Signed)
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
## 2015-02-23 ENCOUNTER — Inpatient Hospital Stay (HOSPITAL_COMMUNITY): Payer: Managed Care, Other (non HMO)

## 2015-02-23 ENCOUNTER — Inpatient Hospital Stay (HOSPITAL_COMMUNITY)
Admission: AD | Admit: 2015-02-23 | Discharge: 2015-02-23 | Disposition: A | Discharge status: Home / Self Care | Payer: Managed Care, Other (non HMO) | Source: Ambulatory Visit | Attending: Obstetrics & Gynecology | Admitting: Obstetrics & Gynecology

## 2015-02-23 ENCOUNTER — Encounter (HOSPITAL_COMMUNITY): Payer: Self-pay | Admitting: *Deleted

## 2015-02-23 DIAGNOSIS — Z3A10 10 weeks gestation of pregnancy: Secondary | ICD-10-CM

## 2015-02-23 DIAGNOSIS — N760 Acute vaginitis: Secondary | ICD-10-CM

## 2015-02-23 DIAGNOSIS — Z3A09 9 weeks gestation of pregnancy: Secondary | ICD-10-CM | POA: Insufficient documentation

## 2015-02-23 DIAGNOSIS — R109 Unspecified abdominal pain: Secondary | ICD-10-CM | POA: Diagnosis not present

## 2015-02-23 DIAGNOSIS — O23591 Infection of other part of genital tract in pregnancy, first trimester: Secondary | ICD-10-CM | POA: Diagnosis not present

## 2015-02-23 DIAGNOSIS — B9689 Other specified bacterial agents as the cause of diseases classified elsewhere: Secondary | ICD-10-CM

## 2015-02-23 DIAGNOSIS — O26899 Other specified pregnancy related conditions, unspecified trimester: Secondary | ICD-10-CM

## 2015-02-23 DIAGNOSIS — O9989 Other specified diseases and conditions complicating pregnancy, childbirth and the puerperium: Secondary | ICD-10-CM | POA: Diagnosis not present

## 2015-02-23 LAB — URINE MICROSCOPIC-ADD ON

## 2015-02-23 LAB — URINALYSIS, ROUTINE W REFLEX MICROSCOPIC
Bilirubin Urine: NEGATIVE
Glucose, UA: NEGATIVE mg/dL
KETONES UR: 15 mg/dL — AB
NITRITE: POSITIVE — AB
PH: 6 (ref 5.0–8.0)
PROTEIN: 100 mg/dL — AB
Specific Gravity, Urine: 1.025 (ref 1.005–1.030)
Urobilinogen, UA: 1 mg/dL (ref 0.0–1.0)

## 2015-02-23 LAB — CBC
HCT: 34.7 % — ABNORMAL LOW (ref 36.0–46.0)
Hemoglobin: 11.3 g/dL — ABNORMAL LOW (ref 12.0–15.0)
MCH: 22.3 pg — AB (ref 26.0–34.0)
MCHC: 32.6 g/dL (ref 30.0–36.0)
MCV: 68.6 fL — ABNORMAL LOW (ref 78.0–100.0)
PLATELETS: 224 10*3/uL (ref 150–400)
RBC: 5.06 MIL/uL (ref 3.87–5.11)
RDW: 20 % — ABNORMAL HIGH (ref 11.5–15.5)
WBC: 7.9 10*3/uL (ref 4.0–10.5)

## 2015-02-23 LAB — WET PREP, GENITAL
Trich, Wet Prep: NONE SEEN
YEAST WET PREP: NONE SEEN

## 2015-02-23 LAB — POCT PREGNANCY, URINE: Preg Test, Ur: POSITIVE — AB

## 2015-02-23 LAB — HCG, QUANTITATIVE, PREGNANCY: HCG, BETA CHAIN, QUANT, S: 47414 m[IU]/mL — AB (ref <5)

## 2015-02-23 LAB — OB RESULTS CONSOLE GC/CHLAMYDIA: GC PROBE AMP, GENITAL: NEGATIVE

## 2015-02-23 LAB — ABO/RH: ABO/RH(D): O POS

## 2015-02-23 MED ORDER — METRONIDAZOLE 500 MG PO TABS: 500.0000 mg | ORAL_TABLET | Freq: Two times a day (BID) | ORAL | Status: DC

## 2015-02-23 NOTE — MAU Note (Signed)
Patient presents with +HPT and c/o abdominal pain since yesterday. Denies bleeding or discharge.

## 2015-02-23 NOTE — MAU Provider Note (Signed)
History     CSN: [id number]  Arrival date and time: 02/23/15 1551   First Provider Initiated Contact with Patient 02/23/15 1637      Chief Complaint  Patient presents with  . Abdominal Pain   HPI  Ms. Lisa Garza is a 19 y.o. G2P0010 at 6032w2d here with report of +HPT and c/o abdominal pain since yesterday.  Pain is unilateral in nature and on left side.  Denies vaginal bleeding or discharge.  Plans to have an abortion on Saturday.    Past Medical History  Diagnosis Date  . Anemia     Past Surgical History  Procedure Laterality Date  . No past surgeries      History reviewed. No pertinent family history.  History  Substance Use Topics  . Smoking status: Never Smoker   . Smokeless tobacco: Not on file  . Alcohol Use: No    Allergies:  Allergies  Allergen Reactions  . Latex Rash  . Sulfa Antibiotics Swelling and Rash    Polymyxin/Trimethoprin opthalmic solution    Prescriptions prior to admission  Medication Sig Dispense Refill Last Dose  . albuterol (PROVENTIL HFA;VENTOLIN HFA) 108 (90 BASE) MCG/ACT inhaler Inhale 2 puffs into the lungs every 6 (six) hours as needed for wheezing or shortness of breath.   rescue  . acetaminophen-codeine (TYLENOL #3) 300-30 MG per tablet Take 1 tablet by mouth every 4 (four) hours as needed for moderate pain. (Patient not taking: Reported on 02/23/2015) 15 tablet 0 Not Taking at Unknown time  . cephALEXin (KEFLEX) 500 MG capsule Take 1 capsule (500 mg total) by mouth 4 (four) times daily. (Patient not taking: Reported on 02/23/2015) 40 capsule 0   . ibuprofen (ADVIL,MOTRIN) 800 MG tablet Take 1 tablet (800 mg total) by mouth every 8 (eight) hours as needed for moderate pain. (Patient not taking: Reported on 02/23/2015) 21 tablet 0 Not Taking at Unknown time  . methocarbamol (ROBAXIN) 500 MG tablet Take 1 tablet (500 mg total) by mouth 2 (two) times daily. (Patient not taking: Reported on 02/23/2015) 20 tablet 0   .  trimethoprim-polymyxin b (POLYTRIM) ophthalmic solution Place 2 drops into the left eye every 4 (four) hours. (Patient not taking: Reported on 02/23/2015) 10 mL 0     Review of Systems  Gastrointestinal: Positive for abdominal pain (LLQ).  Genitourinary: Negative for dysuria, urgency and frequency.       Vaginal discharge   Neurological: Negative for speech change.  All other systems reviewed and are negative.  Physical Exam   Blood pressure 116/61, pulse 84, temperature 98.4 F (36.9 C), temperature source Oral, resp. rate 18, height 5\' 8"  (1.727 m), weight 118.502 kg (261 lb 4 oz), last menstrual period 12/20/2014.  Physical Exam  Constitutional: She is oriented to person, place, and time. She appears well-developed and well-nourished. No distress.  HENT:  Head: Normocephalic.  Neck: Normal range of motion. Neck supple.  Cardiovascular: Normal rate and regular rhythm.   Respiratory: Effort normal and breath sounds normal.  GI: Soft. There is no tenderness. There is no rebound.  Genitourinary: Uterus is enlarged and tender. Cervix exhibits motion tenderness. Right adnexum displays no mass. Left adnexum displays no mass. Vaginal discharge (white, creamy; frothy) found.  +fishy odor  Neurological: She is alert and oriented to person, place, and time.  Skin: Skin is warm and dry.    MAU Course  Procedures  Results for orders placed or performed during the hospital encounter of 02/23/15 (from the past 24  hour(s))  Urinalysis, Routine w reflex microscopic (not at [facility])     Status: Abnormal   Collection Time: 02/23/15  4:10 PM  Result Value Ref Range   Color, Urine YELLOW YELLOW   APPearance CLOUDY (A) CLEAR   Specific Gravity, Urine 1.025 1.005 - 1.030   pH 6.0 5.0 - 8.0   Glucose, UA NEGATIVE NEGATIVE mg/dL   Hgb urine dipstick MODERATE (A) NEGATIVE   Bilirubin Urine NEGATIVE NEGATIVE   Ketones, ur 15 (A) NEGATIVE mg/dL   Protein, ur 811 (A) NEGATIVE mg/dL   Urobilinogen, UA  1.0 0.0 - 1.0 mg/dL   Nitrite POSITIVE (A) NEGATIVE   Leukocytes, UA MODERATE (A) NEGATIVE  Urine microscopic-add on     Status: None   Collection Time: 02/23/15  4:10 PM  Result Value Ref Range   Squamous Epithelial / LPF RARE RARE   WBC, UA 3-6 <3 WBC/hpf   RBC / HPF 3-6 <3 RBC/hpf   Bacteria, UA RARE RARE  Pregnancy, urine POC     Status: Abnormal   Collection Time: 02/23/15  4:29 PM  Result Value Ref Range   Preg Test, Ur POSITIVE (A) NEGATIVE  CBC     Status: Abnormal   Collection Time: 02/23/15  4:50 PM  Result Value Ref Range   WBC 7.9 4.0 - 10.5 K/uL   RBC 5.06 3.87 - 5.11 MIL/uL   Hemoglobin 11.3 (L) 12.0 - 15.0 g/dL   HCT 91.4 (L) 78.2 - 95.6 %   MCV 68.6 (L) 78.0 - 100.0 fL   MCH 22.3 (L) 26.0 - 34.0 pg   MCHC 32.6 30.0 - 36.0 g/dL   RDW 21.3 (H) 08.6 - 57.8 %   Platelets 224 150 - 400 K/uL  hCG, quantitative, pregnancy     Status: Abnormal   Collection Time: 02/23/15  4:50 PM  Result Value Ref Range   hCG, Beta Chain, Quant, S 47414 (H) <5 mIU/mL  ABO/Rh     Status: None (Preliminary result)   Collection Time: 02/23/15  4:50 PM  Result Value Ref Range   ABO/RH(D) O POS   Wet prep, genital     Status: Abnormal   Collection Time: 02/23/15  5:10 PM  Result Value Ref Range   Yeast Wet Prep HPF POC NONE SEEN NONE SEEN   Trich, Wet Prep NONE SEEN NONE SEEN   Clue Cells Wet Prep HPF POC MODERATE (A) NONE SEEN   WBC, Wet Prep HPF POC MODERATE (A) NONE SEEN   Ultrasound: FINDINGS: Intrauterine gestational sac: Visualized/normal in shape.  Yolk sac: Not well visualized  Embryo: Present  Cardiac Activity: Present  Heart Rate: 170 bpm  CRL:  30.9 mm  10 w 0 d         [place] EDC: 09/21/2015  Maternal uterus/adnexae: No definitive subchorionic hemorrhage is identified. The ovaries appear within normal limits. No free pelvic fluid is noted.  IMPRESSION: Single live intrauterine gestation at 21 weeks which roughly corresponds with the patient's  given gestational dates. No acute abnormality is noted.  Assessment and Plan  19 y.o. G2P0010 at [redacted]w[redacted]d Bacterial Vaginosis  Plan; Discharge to home RX flagyl 500 mg BID x 7 days   Marlis Edelson 02/23/2015, 6:38 PM

## 2015-02-24 LAB — GC/CHLAMYDIA PROBE AMP (~~LOC~~) NOT AT ARMC
Chlamydia: NEGATIVE
Neisseria Gonorrhea: NEGATIVE

## 2015-02-24 LAB — HIV ANTIBODY (ROUTINE TESTING W REFLEX): HIV Screen 4th Generation wRfx: NONREACTIVE

## 2015-04-14 ENCOUNTER — Encounter: Payer: Self-pay | Admitting: Certified Nurse Midwife

## 2015-04-19 ENCOUNTER — Encounter: Payer: Self-pay | Admitting: Obstetrics

## 2015-04-19 ENCOUNTER — Ambulatory Visit (INDEPENDENT_AMBULATORY_CARE_PROVIDER_SITE_OTHER): Payer: 59 | Admitting: Certified Nurse Midwife

## 2015-04-19 ENCOUNTER — Encounter: Payer: Self-pay | Admitting: Certified Nurse Midwife

## 2015-04-19 VITALS — BP 129/82 | HR 72 | Temp 97.6°F | Wt 269.0 lb

## 2015-04-19 DIAGNOSIS — B9689 Other specified bacterial agents as the cause of diseases classified elsewhere: Secondary | ICD-10-CM

## 2015-04-19 DIAGNOSIS — N760 Acute vaginitis: Secondary | ICD-10-CM

## 2015-04-19 DIAGNOSIS — B3731 Acute candidiasis of vulva and vagina: Secondary | ICD-10-CM

## 2015-04-19 DIAGNOSIS — B373 Candidiasis of vulva and vagina: Secondary | ICD-10-CM

## 2015-04-19 DIAGNOSIS — A499 Bacterial infection, unspecified: Secondary | ICD-10-CM

## 2015-04-19 DIAGNOSIS — Z3481 Encounter for supervision of other normal pregnancy, first trimester: Secondary | ICD-10-CM

## 2015-04-19 DIAGNOSIS — Z3402 Encounter for supervision of normal first pregnancy, second trimester: Secondary | ICD-10-CM

## 2015-04-19 DIAGNOSIS — Z3400 Encounter for supervision of normal first pregnancy, unspecified trimester: Secondary | ICD-10-CM | POA: Insufficient documentation

## 2015-04-19 LAB — POCT URINALYSIS DIPSTICK
BILIRUBIN UA: NEGATIVE
Blood, UA: NEGATIVE
GLUCOSE UA: NORMAL
KETONES UA: NEGATIVE
NITRITE UA: POSITIVE
PH UA: 5
Protein, UA: NEGATIVE
SPEC GRAV UA: 1.02
Urobilinogen, UA: NEGATIVE

## 2015-04-19 LAB — TSH: TSH: 1.61 u[IU]/mL (ref 0.350–4.500)

## 2015-04-19 MED ORDER — VITAFOL-PLUS 27-1-200 MG PO CAPS: 1.0000 | ORAL_CAPSULE | Freq: Every day | ORAL | Status: DC

## 2015-04-19 MED ORDER — FLUCONAZOLE 100 MG PO TABS: 100.0000 mg | ORAL_TABLET | Freq: Once | ORAL | Status: DC

## 2015-04-19 MED ORDER — METRONIDAZOLE 0.75 % VA GEL: 1.0000 | Freq: Two times a day (BID) | VAGINAL | Status: DC

## 2015-04-19 MED ORDER — TINIDAZOLE 500 MG PO TABS: 2.0000 g | ORAL_TABLET | Freq: Every day | ORAL | Status: AC

## 2015-04-19 NOTE — Progress Notes (Signed)
Subjective:    Lisa Garza is being seen today for her first obstetrical visit.  This is not a planned pregnancy. She is at [redacted]w[redacted]d gestation. Her obstetrical history is significant for obesity and anemia. Relationship with FOB: significant other, not living together. Patient undecided intend to breast feed. Pregnancy history fully reviewed.  The information documented in the HPI was reviewed and verified.  Menstrual History: OB History    Gravida Para Term Preterm AB TAB SAB Ectopic Multiple Living   0     SAB:  years of age, less than 75 weeks Menarche age: 49  Patient's last menstrual period was 12/20/2014.    Past Medical History  Diagnosis Date  . Anemia     Past Surgical History  Procedure Laterality Date  . No past surgeries       (Not in a hospital admission) Allergies  Allergen Reactions  . Latex Rash  . Sulfa Antibiotics Swelling and Rash    Polymyxin/Trimethoprin opthalmic solution    Social History  Substance Use Topics  . Smoking status: Never Smoker   . Smokeless tobacco: Not on file  . Alcohol Use: No    No family history on file.   Review of Systems Constitutional: negative for weight loss Gastrointestinal: negative for vomiting Genitourinary:negative for genital lesions and vaginal discharge and dysuria Musculoskeletal:negative for back pain Behavioral/Psych: negative for abusive relationship, depression, illegal drug usage and tobacco use    Objective:    LMP 12/20/2014 General Appearance:    Alert, cooperative, no distress, appears stated age  Head:    Normocephalic, without obvious abnormality, atraumatic  Eyes:    PERRL, conjunctiva/corneas clear, EOM's intact, fundi    benign, both eyes  Ears:    Normal TM's and external ear canals, both ears  Nose:   Nares normal, septum midline, mucosa normal, no drainage    or sinus tenderness  Throat:   Lips, mucosa, and tongue normal; teeth and gums normal  Neck:   Supple,  symmetrical, trachea midline, no adenopathy;    thyroid:  no enlargement/tenderness/nodules; no carotid   bruit or JVD  Back:     Symmetric, no curvature, ROM normal, no CVA tenderness  Lungs:     Clear to auscultation bilaterally, respirations unlabored  Chest Wall:    No tenderness or deformity   Heart:    Regular rate and rhythm, S1 and S2 normal, no murmur, rub   or gallop  Breast Exam:    No tenderness, masses, or nipple abnormality  Abdomen:     Soft, non-tender, bowel sounds active all four quadrants,    no masses, no organomegaly  Genitalia:    Normal female without lesion, discharge or tenderness  Extremities:   Extremities normal, atraumatic, no cyanosis or edema  Pulses:   2+ and symmetric all extremities  Skin:   Skin color, texture, turgor normal, no rashes or lesions  Lymph nodes:   Cervical, supraclavicular, and axillary nodes normal  Neurologic:   CNII-XII intact, normal strength, sensation and reflexes    throughout     Cervix:  Long, thick, closed, posterior.  Frothy malodorous discharge present with speculum exam.  Friable cervix. No CMT.     FHR by doppler: 150's    Lab Review Urine pregnancy test Labs reviewed yes Radiologic studies reviewed yes Assessment:    Pregnancy at [redacted]w[redacted]d weeks   Obesity Anemia VVC/BV   Plan:      Prenatal vitamins.  Counseling provided regarding continued use of seat belts, cessation of alcohol consumption, smoking or use of illicit drugs; infection precautions i.e., influenza/TDAP immunizations, toxoplasmosis,CMV, parvovirus, listeria and varicella; workplace safety, exercise during pregnancy; routine dental care, safe medications, sexual activity, hot tubs, saunas, pools, travel, caffeine use, fish and methlymercury, potential toxins, hair treatments, varicose veins Weight gain recommendations per IOM guidelines reviewed: underweight/BMI< 18.5--> gain 28 - 40 lbs; normal weight/BMI 18.5 - 24.9--> gain 25 - 35 lbs; overweight/BMI 25  - 29.9--> gain 15 - 25 lbs; obese/BMI >30->gain  11 - 20 lbs Problem list reviewed and updated. FIRST/CF mutation testing/NIPT/QUAD SCREEN/fragile X/Ashkenazi Jewish population testing/Spinal muscular atrophy discussed: requested, ordered. Role of ultrasound in pregnancy discussed; fetal survey: requested, ordered. Amniocentesis discussed: not indicated. VBAC calculator score: VBAC consent form provided No orders of the defined types were placed in this encounter.   No orders of the defined types were placed in this encounter.    Follow up in 2 weeks. 50% of 30 min visit spent on counseling and coordination of care.

## 2015-04-20 LAB — AFP, QUAD SCREEN
AFP: 22.9 ng/mL
Curr Gest Age: 17.6 wks.days
Down Syndrome Scr Risk Est: 1:31400 {titer}
HCG, Total: 8.54 IU/mL
INH: 81.2 pg/mL
Interpretation-AFP: NEGATIVE
MOM FOR AFP: 0.73
MoM for INH: 0.66
MoM for hCG: 0.41
Open Spina bifida: NEGATIVE
Tri 18 Scr Risk Est: NEGATIVE
UE3 VALUE: 1.34 ng/mL
uE3 Mom: 1.25

## 2015-04-20 LAB — OBSTETRIC PANEL
Antibody Screen: NEGATIVE
BASOS ABS: 0 10*3/uL (ref 0.0–0.1)
Basophils Relative: 0 % (ref 0–1)
Eosinophils Absolute: 0.1 10*3/uL (ref 0.0–0.7)
Eosinophils Relative: 1 % (ref 0–5)
HCT: 36.3 % (ref 36.0–46.0)
Hemoglobin: 11.4 g/dL — ABNORMAL LOW (ref 12.0–15.0)
Hepatitis B Surface Ag: NEGATIVE
LYMPHS PCT: 26 % (ref 12–46)
Lymphs Abs: 1.6 10*3/uL (ref 0.7–4.0)
MCH: 23 pg — AB (ref 26.0–34.0)
MCHC: 31.4 g/dL (ref 30.0–36.0)
MCV: 73.2 fL — ABNORMAL LOW (ref 78.0–100.0)
MONOS PCT: 9 % (ref 3–12)
Monocytes Absolute: 0.6 10*3/uL (ref 0.1–1.0)
NEUTROS PCT: 64 % (ref 43–77)
Neutro Abs: 4 10*3/uL (ref 1.7–7.7)
PLATELETS: 203 10*3/uL (ref 150–400)
RBC: 4.96 MIL/uL (ref 3.87–5.11)
RDW: 17.5 % — ABNORMAL HIGH (ref 11.5–15.5)
RUBELLA: 2.75 {index} — AB (ref <0.90)
Rh Type: POSITIVE
WBC: 6.2 10*3/uL (ref 4.0–10.5)

## 2015-04-20 LAB — PAP IG W/ RFLX HPV ASCU

## 2015-04-20 LAB — VITAMIN D 25 HYDROXY (VIT D DEFICIENCY, FRACTURES): Vit D, 25-Hydroxy: 11 ng/mL — ABNORMAL LOW (ref 30–100)

## 2015-04-20 LAB — HIV ANTIBODY (ROUTINE TESTING W REFLEX): HIV: NONREACTIVE

## 2015-04-20 LAB — VARICELLA ZOSTER ANTIBODY, IGG: Varicella IgG: 2534 Index — ABNORMAL HIGH (ref <135.00)

## 2015-04-21 LAB — HEMOGLOBINOPATHY EVALUATION
Hemoglobin Other: 0 %
Hgb A2 Quant: 2.4 % (ref 2.2–3.2)
Hgb A: 97.6 % (ref 96.8–97.8)
Hgb F Quant: 0 % (ref 0.0–2.0)
Hgb S Quant: 0 %

## 2015-04-21 LAB — CULTURE, OB URINE: Colony Count: >=100000

## 2015-04-22 ENCOUNTER — Other Ambulatory Visit: Payer: Self-pay | Admitting: Certified Nurse Midwife

## 2015-04-22 DIAGNOSIS — O2342 Unspecified infection of urinary tract in pregnancy, second trimester: Secondary | ICD-10-CM

## 2015-04-22 LAB — SURESWAB, VAGINOSIS/VAGINITIS PLUS
ATOPOBIUM VAGINAE: 6.2 Log (cells/mL)
C. ALBICANS, DNA: NOT DETECTED
C. TRACHOMATIS RNA, TMA: NOT DETECTED
C. glabrata, DNA: NOT DETECTED
C. parapsilosis, DNA: NOT DETECTED
C. tropicalis, DNA: NOT DETECTED
Gardnerella vaginalis: >8 Log (cells/mL)
LACTOBACILLUS SPECIES: NOT DETECTED Log (cells/mL)
MEGASPHAERA SPECIES: 7.9 Log (cells/mL)
N. gonorrhoeae RNA, TMA: NOT DETECTED
T. vaginalis RNA, QL TMA: NOT DETECTED

## 2015-04-22 MED ORDER — NITROFURANTOIN MONOHYD MACRO 100 MG PO CAPS: 100.0000 mg | ORAL_CAPSULE | Freq: Two times a day (BID) | ORAL | Status: AC

## 2015-05-03 ENCOUNTER — Ambulatory Visit (HOSPITAL_COMMUNITY)
Admission: RE | Admit: 2015-05-03 | Discharge: 2015-05-03 | Disposition: A | Discharge status: Home / Self Care | Payer: 59 | Source: Ambulatory Visit | Attending: Certified Nurse Midwife | Admitting: Certified Nurse Midwife

## 2015-05-03 ENCOUNTER — Ambulatory Visit (INDEPENDENT_AMBULATORY_CARE_PROVIDER_SITE_OTHER): Payer: 59 | Admitting: Certified Nurse Midwife

## 2015-05-03 ENCOUNTER — Other Ambulatory Visit: Payer: Self-pay | Admitting: Certified Nurse Midwife

## 2015-05-03 VITALS — BP 125/75 | HR 73 | Temp 99.0°F | Wt 265.0 lb

## 2015-05-03 DIAGNOSIS — Z3A19 19 weeks gestation of pregnancy: Secondary | ICD-10-CM | POA: Diagnosis not present

## 2015-05-03 DIAGNOSIS — Z3481 Encounter for supervision of other normal pregnancy, first trimester: Secondary | ICD-10-CM

## 2015-05-03 DIAGNOSIS — Z3689 Encounter for other specified antenatal screening: Secondary | ICD-10-CM

## 2015-05-03 DIAGNOSIS — Z3402 Encounter for supervision of normal first pregnancy, second trimester: Secondary | ICD-10-CM

## 2015-05-03 LAB — POCT URINALYSIS DIPSTICK
BILIRUBIN UA: NEGATIVE
GLUCOSE UA: NEGATIVE
Nitrite, UA: NEGATIVE
Spec Grav, UA: 1.02
UROBILINOGEN UA: NEGATIVE
pH, UA: 5

## 2015-05-03 NOTE — Progress Notes (Signed)
Subjective:    Lisa Garza is a 19 y.o. female being seen today for her obstetrical visit. She is at [redacted]w[redacted]d gestation. Patient reports: no complaints and has not been able to pick up Rx's sent in because her medicaid is not activated yet, states will be active on Friday and plans to pick them up then.  She also stated that she is having gum line itching with eating fatty foods.  Given instruction on anaphylaxis and what to do.  Had ultrasound at hospital today, no results yet in computer. Fetal movement: normal.  Problem List Items Addressed This Visit    None    Visit Diagnoses    Encounter for supervision of normal first pregnancy in second trimester    -  Primary    Relevant Orders    POCT urinalysis dipstick (Completed)      Patient Active Problem List   Diagnosis Date Noted  . Supervision of normal first pregnancy 04/19/2015   Objective:    BP 125/75 mmHg  Pulse 73  Temp(Src) 99 F (37.2 C)  Wt 265 lb (120.203 kg)  LMP 12/20/2014 FHT: 160 BPM  Uterine Size: size equals dates     Assessment:    Pregnancy @ [redacted]w[redacted]d    Plan:    OBGCT: discussed. Signs and symptoms of preterm labor: discussed.  Labs, problem list reviewed and updated 2 hr GTT planned Follow up in 4 weeks.

## 2015-05-05 ENCOUNTER — Other Ambulatory Visit: Payer: Self-pay | Admitting: Certified Nurse Midwife

## 2015-05-11 ENCOUNTER — Other Ambulatory Visit: Payer: Self-pay | Admitting: Certified Nurse Midwife

## 2015-05-31 ENCOUNTER — Telehealth: Payer: Self-pay | Admitting: Certified Nurse Midwife

## 2015-05-31 ENCOUNTER — Encounter: Payer: 59 | Admitting: Certified Nurse Midwife

## 2015-06-02 NOTE — Telephone Encounter (Signed)
Close encounter 

## 2015-06-15 ENCOUNTER — Ambulatory Visit (INDEPENDENT_AMBULATORY_CARE_PROVIDER_SITE_OTHER): Payer: 59 | Admitting: Certified Nurse Midwife

## 2015-06-15 ENCOUNTER — Encounter: Payer: Self-pay | Admitting: Obstetrics

## 2015-06-15 VITALS — BP 130/80 | HR 89 | Wt 268.0 lb

## 2015-06-15 DIAGNOSIS — O219 Vomiting of pregnancy, unspecified: Secondary | ICD-10-CM

## 2015-06-15 DIAGNOSIS — Z3402 Encounter for supervision of normal first pregnancy, second trimester: Secondary | ICD-10-CM

## 2015-06-15 DIAGNOSIS — K219 Gastro-esophageal reflux disease without esophagitis: Secondary | ICD-10-CM

## 2015-06-15 DIAGNOSIS — Z3481 Encounter for supervision of other normal pregnancy, first trimester: Secondary | ICD-10-CM | POA: Diagnosis not present

## 2015-06-15 DIAGNOSIS — K5901 Slow transit constipation: Secondary | ICD-10-CM

## 2015-06-15 MED ORDER — POLYETHYLENE GLYCOL 3350 17 GM/SCOOP PO POWD
17.0000 g | Freq: Every day | ORAL | Status: DC
Start: 2015-06-15 — End: 2015-09-07

## 2015-06-15 MED ORDER — DOCUSATE SODIUM 100 MG PO CAPS: 100.0000 mg | ORAL_CAPSULE | Freq: Two times a day (BID) | ORAL | Status: DC

## 2015-06-15 MED ORDER — ONDANSETRON HCL 4 MG PO TABS: 4.0000 mg | ORAL_TABLET | Freq: Three times a day (TID) | ORAL | Status: DC | PRN

## 2015-06-15 MED ORDER — FAMOTIDINE 20 MG PO TABS: 20.0000 mg | ORAL_TABLET | Freq: Two times a day (BID) | ORAL | Status: DC

## 2015-06-15 MED ORDER — CITRANATAL HARMONY 30-1-260 MG PO CAPS: 1.0000 | ORAL_CAPSULE | Freq: Every day | ORAL | Status: DC

## 2015-06-15 NOTE — Progress Notes (Signed)
Subjective:    Lisa Garza is a 19 y.o. female being seen today for her obstetrical visit. She is at 2424w0d gestation. Patient reports: backache, heartburn, nausea, no contractions, no cramping, no leaking and constipation with straining.  States that on Sunday when she wiped after trying to have a BM she had a small spot of blood, nothing since and no cramping. Reports a lot of reflux and has tried Tums without relief of symptoms.  Fetal movement: normal.  Problem List Items Addressed This Visit    None    Visit Diagnoses    Encounter for supervision of normal first pregnancy in second trimester    -  Primary    Relevant Orders    POCT urinalysis dipstick    Nausea and vomiting during pregnancy        Relevant Medications    ondansetron (ZOFRAN) 4 MG tablet    Gastroesophageal reflux disease without esophagitis        Relevant Medications    famotidine (PEPCID) 20 MG tablet    ondansetron (ZOFRAN) 4 MG tablet    polyethylene glycol powder (GLYCOLAX/MIRALAX) powder    docusate sodium (COLACE) 100 MG capsule    Slow transit constipation        Relevant Medications    polyethylene glycol powder (GLYCOLAX/MIRALAX) powder    docusate sodium (COLACE) 100 MG capsule      Patient Active Problem List   Diagnosis Date Noted  . Supervision of normal first pregnancy 04/19/2015   Objective:    BP 130/80 mmHg  Pulse 89  Wt 268 lb (121.564 kg)  LMP 12/20/2014 FHT: 145 BPM  Uterine Size: size equals dates   Cervix: long, thick, closed and posterior.  No rectal hemorrhoids present.   Assessment:    Pregnancy @ 524w0d    Constipation with straining  GERD  N&V  Plan:    OBGCT: discussed. Signs and symptoms of preterm labor: discussed.  Labs, problem list reviewed and updated 2 hr GTT planned for next ROB Follow up in 2 weeks.

## 2015-06-28 ENCOUNTER — Other Ambulatory Visit: Payer: 59

## 2015-06-28 ENCOUNTER — Encounter: Payer: 59 | Admitting: Certified Nurse Midwife

## 2015-06-28 ENCOUNTER — Ambulatory Visit (INDEPENDENT_AMBULATORY_CARE_PROVIDER_SITE_OTHER): Payer: 59 | Admitting: Certified Nurse Midwife

## 2015-06-28 ENCOUNTER — Encounter: Payer: Self-pay | Admitting: Obstetrics

## 2015-06-28 VITALS — BP 108/74 | HR 90 | Temp 98.7°F | Wt 270.0 lb

## 2015-06-28 DIAGNOSIS — Z3482 Encounter for supervision of other normal pregnancy, second trimester: Secondary | ICD-10-CM

## 2015-06-28 DIAGNOSIS — Z3402 Encounter for supervision of normal first pregnancy, second trimester: Secondary | ICD-10-CM

## 2015-06-28 LAB — CBC
HEMATOCRIT: 33.8 % — AB (ref 36.0–46.0)
Hemoglobin: 10.6 g/dL — ABNORMAL LOW (ref 12.0–15.0)
MCH: 22.6 pg — ABNORMAL LOW (ref 26.0–34.0)
MCHC: 31.4 g/dL (ref 30.0–36.0)
MCV: 71.9 fL — ABNORMAL LOW (ref 78.0–100.0)
MPV: 11.3 fL (ref 8.6–12.4)
Platelets: 204 10*3/uL (ref 150–400)
RBC: 4.7 MIL/uL (ref 3.87–5.11)
RDW: 15.1 % (ref 11.5–15.5)
WBC: 6.6 10*3/uL (ref 4.0–10.5)

## 2015-06-28 NOTE — Progress Notes (Signed)
Subjective:    Lisa Garza is a 19 y.o. female being seen today for her obstetrical visit. She is at 819w6d gestation. Patient reports no complaints. Fetal movement: normal.  Problem List Items Addressed This Visit    None    Visit Diagnoses    Encounter for supervision of normal first pregnancy in second trimester    -  Primary    Relevant Orders    Glucose Tolerance, 2 Hours w/1 Hour    CBC    HIV antibody    RPR    POCT urinalysis dipstick      Patient Active Problem List   Diagnosis Date Noted  . Supervision of normal first pregnancy 04/19/2015   Objective:    BP 108/74 mmHg  Pulse 90  Temp(Src) 98.7 F (37.1 C)  Wt 270 lb (122.471 kg)  LMP 12/20/2014 FHT:  150 BPM  Uterine Size: size equals dates  Presentation: cephalic     Assessment:    Pregnancy @ 5019w6d weeks   Doing well  Plan:     labs reviewed, problem list updated Consent signed. GBS planning TDAP offered  Rhogam given for RH negative Pediatrician: discussed. Infant feeding: plans to breastfeed. Maternity leave: discussed. Cigarette smoking: never smoked. Orders Placed This Encounter  Procedures  . Glucose Tolerance, 2 Hours w/1 Hour  . CBC  . HIV antibody  . RPR  . POCT urinalysis dipstick   No orders of the defined types were placed in this encounter.   Follow up in 2 Weeks.

## 2015-06-29 ENCOUNTER — Other Ambulatory Visit: Payer: Self-pay | Admitting: Certified Nurse Midwife

## 2015-06-29 LAB — GLUCOSE TOLERANCE, 2 HOURS W/ 1HR
Glucose, 1 hour: 93 mg/dL (ref 70–170)
Glucose, 2 hour: 84 mg/dL (ref 70–139)
Glucose, Fasting: 79 mg/dL (ref 65–99)

## 2015-06-29 LAB — RPR

## 2015-06-29 LAB — HIV ANTIBODY (ROUTINE TESTING W REFLEX): HIV 1&2 Ab, 4th Generation: NONREACTIVE

## 2015-07-06 ENCOUNTER — Telehealth: Payer: Self-pay | Admitting: *Deleted

## 2015-07-06 ENCOUNTER — Ambulatory Visit (HOSPITAL_COMMUNITY): Admission: RE | Admit: 2015-07-06 | Payer: Medicaid Other | Source: Ambulatory Visit

## 2015-07-06 NOTE — Telephone Encounter (Signed)
Pt called to office for return call  Return call made to pt.  Pt states that her Medicaid has been approved and she is wanting to get Rx that have been sent to pharmacy.  Pt was advised to go to pharmacy and provide them her Medicaid information. Pt advised that pharmacy should have Rxs on file. Pt advised to call office if any problems getting Rx.

## 2015-07-12 ENCOUNTER — Encounter: Payer: 59 | Admitting: Obstetrics

## 2015-07-12 ENCOUNTER — Encounter: Payer: 59 | Admitting: Certified Nurse Midwife

## 2015-07-14 ENCOUNTER — Other Ambulatory Visit: Payer: Self-pay | Admitting: Certified Nurse Midwife

## 2015-07-26 ENCOUNTER — Other Ambulatory Visit: Payer: Self-pay | Admitting: Certified Nurse Midwife

## 2015-08-09 ENCOUNTER — Ambulatory Visit (INDEPENDENT_AMBULATORY_CARE_PROVIDER_SITE_OTHER): Payer: PRIVATE HEALTH INSURANCE | Admitting: Certified Nurse Midwife

## 2015-08-09 VITALS — BP 122/85 | HR 80 | Temp 97.8°F | Wt 270.0 lb

## 2015-08-09 DIAGNOSIS — Z3403 Encounter for supervision of normal first pregnancy, third trimester: Secondary | ICD-10-CM

## 2015-08-09 NOTE — Progress Notes (Signed)
Subjective:    Lisa Garza is a 19 y.o. female being seen today for her obstetrical visit. She is at 4453w6d gestation. Patient reports no complaints. Fetal movement: normal.  States she did not go to her US because she was not aware of when and where her US was.     Problem List Items Addressed This Visit    None    Visit Diagnoses    Encounter for supervision of normal first pregnancy in third trimester    -  Primary    Relevant Orders    POCT urinalysis dipstick    US OB Follow Up      Patient Active Problem List   Diagnosis Date Noted  . Supervision of normal first pregnancy 04/19/2015   Objective:    BP 122/85 mmHg  Pulse 80  Temp(Src) 97.8 F (36.6 C)  Wt 270 lb (122.471 kg)  LMP 12/20/2014 FHT:  140 BPM  Uterine Size: size equals dates  Presentation: cephalic     Assessment:    Pregnancy @ 7253w6d weeks   Plan:     labs reviewed, problem list updated Consent signed. GBS sent TDAP offered  Rhogam given for RH negative Pediatrician: discussed. Infant feeding: plans to breastfeed. Maternity leave: discussed. Cigarette smoking: never smoked. Orders Placed This Encounter  Procedures  . US OB Follow Up    Standing Status: Future     Number of Occurrences:      Standing Expiration Date: 10/09/2016    Order Specific Question:  Reason for Exam (SYMPTOM  OR DIAGNOSIS REQUIRED)    Answer:  f/u fetal anatomy, growth    Order Specific Question:  Preferred [facility]?    [facility]  . POCT urinalysis dipstick   No orders of the defined types were placed in this encounter.   Follow up in 2 Weeks.

## 2015-08-09 NOTE — Progress Notes (Signed)
Pt is doing well.

## 2015-08-10 ENCOUNTER — Telehealth: Payer: Self-pay

## 2015-08-10 NOTE — Telephone Encounter (Signed)
PATIENT REQUESTED A MONDAY OR FRIDAY LATE PM AP[facility]ATEST THEY DO IS 3PM - PATIENT WAS CALLED 08/10/15 AND TOLD DATE AND TIME [facility] - SHE SAID THAT WAS FINE AND WILL GO

## 2015-08-15 ENCOUNTER — Ambulatory Visit (HOSPITAL_COMMUNITY)
Admission: RE | Admit: 2015-08-15 | Discharge: 2015-08-15 | Disposition: A | Discharge status: Home / Self Care | Payer: 59 | Source: Ambulatory Visit | Attending: Certified Nurse Midwife | Admitting: Certified Nurse Midwife

## 2015-08-15 DIAGNOSIS — Z3402 Encounter for supervision of normal first pregnancy, second trimester: Secondary | ICD-10-CM | POA: Insufficient documentation

## 2015-08-23 ENCOUNTER — Ambulatory Visit (INDEPENDENT_AMBULATORY_CARE_PROVIDER_SITE_OTHER): Payer: PRIVATE HEALTH INSURANCE | Admitting: Certified Nurse Midwife

## 2015-08-23 VITALS — BP 127/76 | HR 79 | Temp 98.3°F | Wt 272.0 lb

## 2015-08-23 DIAGNOSIS — Z3403 Encounter for supervision of normal first pregnancy, third trimester: Secondary | ICD-10-CM

## 2015-08-23 DIAGNOSIS — O2343 Unspecified infection of urinary tract in pregnancy, third trimester: Secondary | ICD-10-CM

## 2015-08-23 LAB — POCT URINALYSIS DIPSTICK
BILIRUBIN UA: NEGATIVE
GLUCOSE UA: NEGATIVE
Nitrite, UA: POSITIVE
SPEC GRAV UA: 1.015
Urobilinogen, UA: NEGATIVE
pH, UA: 7

## 2015-08-24 LAB — STREP B DNA PROBE: GBSP: DETECTED

## 2015-08-24 MED ORDER — NITROFURANTOIN MONOHYD MACRO 100 MG PO CAPS: 100.0000 mg | ORAL_CAPSULE | Freq: Two times a day (BID) | ORAL | Status: AC

## 2015-08-24 NOTE — Addendum Note (Signed)
Addended by: Henriette CombsHATTON, Keishia Ground L on: 08/24/2015 11:47 AM   Modules accepted: Orders

## 2015-08-24 NOTE — Progress Notes (Signed)
Subjective:    Lisa Garza is a 19 y.o. female being seen today for her obstetrical visit. She is at 3970w0d gestation. Patient reports no complaints. Fetal movement: normal.  Problem List Items Addressed This Visit    None    Visit Diagnoses    Encounter for supervision of normal first pregnancy in third trimester    -  Primary    Relevant Orders    POCT urinalysis dipstick (Completed)    Strep B DNA probe    UTI in pregnancy, antepartum, third trimester        Relevant Medications    nitrofurantoin, macrocrystal-monohydrate, (MACROBID) 100 MG capsule      Patient Active Problem List   Diagnosis Date Noted  . Supervision of normal first pregnancy 04/19/2015   Objective:    BP 127/76 mmHg  Pulse 79  Temp(Src) 98.3 F (36.8 C)  Wt 272 lb (123.378 kg)  LMP 12/20/2014 FHT:  145 BPM  Uterine Size: size equals dates  Presentation: cephalic     Assessment:    Pregnancy @ 5370w0d weeks   Frequent UTIs in pregnancy: suppression tx started  Plan:     labs reviewed, problem list updated Consent signed. GBS sent TDAP offered  Rhogam given for RH negative Pediatrician: discussed. Infant feeding: plans to breastfeed. Maternity leave: discussed. Cigarette smoking: never smoked. Orders Placed This Encounter  Procedures  . Strep B DNA probe  . POCT urinalysis dipstick   Meds ordered this encounter  Medications  . nitrofurantoin, macrocrystal-monohydrate, (MACROBID) 100 MG capsule    Sig: Take 1 capsule (100 mg total) by mouth 2 (two) times daily. For 14 days, then one a day for the duration of pregnancy    Dispense:  35 capsule    Refill:  2   Follow up in 1 Week.

## 2015-08-25 ENCOUNTER — Other Ambulatory Visit: Payer: Self-pay | Admitting: Certified Nurse Midwife

## 2015-08-26 LAB — CULTURE, OB URINE: Colony Count: >=100000

## 2015-08-28 NOTE — L&D Delivery Note (Signed)
Delivery Note This is a 20 year old G 2 P0 who was admitted for Early latent labor.. She progressed normally with pitocin and epidural to the second stage of labor.  She pushed for 10 min.  At 10:44 AM a viable female was delivered via cephalic, over an intact perineum.  A loose nuchal cord   was identified. Infant placed on maternal abdomen.  Delayed cord clamping was performed for 5-10 minutes.  Cord double clamped and cut.  Apgar scores were 8 and 9. The placenta delivered spontaneously, shultz, with a 3 vessel cord.  Inspection revealed none. The uterus was firm bleeding stable.  EBL was 100.    Placenta and umbilical artery blood gas were not sent.  There were no complications during the procedure.  Mom and baby skin to skin following delivery. Left in stable condition.  Vaginal, Spontaneous Delivery (Presentation: Left Occiput Anterior).  APGAR: 8, 9; weight  .   Placenta status: Intact, Spontaneous.  Cord: 3 vessels with the following complications: None.  Cord pH: N/A  Anesthesia: Epidural  Episiotomy: None Lacerations: None Suture Repair: none Est. Blood Loss (mL): 100  Mom to postpartum.  Baby to Couplet care / Skin to Skin.  Roe Coombsachelle A Denney, CNM 09/07/2015, 11:28 AM

## 2015-08-30 ENCOUNTER — Other Ambulatory Visit: Payer: Self-pay | Admitting: Certified Nurse Midwife

## 2015-08-31 ENCOUNTER — Encounter: Payer: PRIVATE HEALTH INSURANCE | Admitting: Certified Nurse Midwife

## 2015-09-06 ENCOUNTER — Ambulatory Visit (INDEPENDENT_AMBULATORY_CARE_PROVIDER_SITE_OTHER): Payer: PRIVATE HEALTH INSURANCE | Admitting: Certified Nurse Midwife

## 2015-09-06 ENCOUNTER — Encounter (HOSPITAL_COMMUNITY): Payer: Self-pay | Admitting: *Deleted

## 2015-09-06 ENCOUNTER — Inpatient Hospital Stay (HOSPITAL_COMMUNITY)
Admission: AD | Admit: 2015-09-06 | Discharge: 2015-09-09 | DRG: 775 | Disposition: A | Discharge status: Home / Self Care | Payer: Managed Care, Other (non HMO) | Source: Ambulatory Visit | Attending: Obstetrics | Admitting: Obstetrics

## 2015-09-06 VITALS — BP 122/81 | HR 67 | Temp 98.0°F | Wt 275.0 lb

## 2015-09-06 DIAGNOSIS — Z3A38 38 weeks gestation of pregnancy: Secondary | ICD-10-CM

## 2015-09-06 DIAGNOSIS — O99214 Obesity complicating childbirth: Secondary | ICD-10-CM | POA: Diagnosis present

## 2015-09-06 DIAGNOSIS — Z3403 Encounter for supervision of normal first pregnancy, third trimester: Secondary | ICD-10-CM

## 2015-09-06 DIAGNOSIS — E611 Iron deficiency: Secondary | ICD-10-CM | POA: Diagnosis present

## 2015-09-06 DIAGNOSIS — O9902 Anemia complicating childbirth: Principal | ICD-10-CM | POA: Diagnosis present

## 2015-09-06 DIAGNOSIS — Z6841 Body Mass Index (BMI) 40.0 and over, adult: Secondary | ICD-10-CM

## 2015-09-06 DIAGNOSIS — D649 Anemia, unspecified: Secondary | ICD-10-CM | POA: Diagnosis present

## 2015-09-06 LAB — POCT FERN TEST: POCT Fern Test: POSITIVE

## 2015-09-06 NOTE — Progress Notes (Signed)
Subjective:    Lisa Garza is a 20 y.o. female being seen today for her obstetrical visit. She is at 407w6d gestation. Patient reports backache, no bleeding, no contractions, no cramping and no leaking. Fetal movement: normal.  States that the back pain is constant.  Discussed remedies for the back pain; ie: heating pad, rest, massage, etc.  Has not been able to pick up Macrobid for suppression tx, states Medicaid card was not accepted at the pharmacy, states that she got a new card and is going today.   Problem List Items Addressed This Visit    None    Visit Diagnoses    Encounter for supervision of normal first pregnancy in third trimester    -  Primary    Relevant Orders    POCT urinalysis dipstick      Patient Active Problem List   Diagnosis Date Noted  . Supervision of normal first pregnancy 04/19/2015    Objective:    BP 122/81 mmHg  Pulse 67  Temp(Src) 98 F (36.7 C)  Wt 275 lb (124.739 kg)  LMP 12/20/2014 FHT: 150 BPM  Uterine Size: unable to determine d/t maternal habitus  Presentations: cephalic  Pelvic Exam: deferred     Assessment:    Pregnancy @ 407w6d weeks   Lumbar back pain Plan:    Rx for maternity support belt Plans for delivery: Vaginal anticipated; labs reviewed; problem list updated Counseling: Consent signed. Infant feeding: plans to breastfeed. Cigarette smoking: never smoked. L&D discussion: symptoms of labor, discussed when to call, discussed what number to call, anesthetic/analgesic options reviewed and delivering clinician:  plans no preference. Postpartum supports and preparation: circumcision discussed and contraception plans discussed.  Follow up in 1 Week.

## 2015-09-06 NOTE — MAU Note (Signed)
Pt reports she started leaking fluid at 1900, denies pain.

## 2015-09-07 ENCOUNTER — Encounter (HOSPITAL_COMMUNITY): Payer: Self-pay | Admitting: *Deleted

## 2015-09-07 ENCOUNTER — Inpatient Hospital Stay (HOSPITAL_COMMUNITY): Payer: Managed Care, Other (non HMO) | Admitting: Anesthesiology

## 2015-09-07 DIAGNOSIS — O9902 Anemia complicating childbirth: Secondary | ICD-10-CM | POA: Diagnosis present

## 2015-09-07 DIAGNOSIS — Z3A38 38 weeks gestation of pregnancy: Secondary | ICD-10-CM | POA: Diagnosis not present

## 2015-09-07 DIAGNOSIS — O99214 Obesity complicating childbirth: Secondary | ICD-10-CM | POA: Diagnosis present

## 2015-09-07 DIAGNOSIS — D649 Anemia, unspecified: Secondary | ICD-10-CM | POA: Diagnosis present

## 2015-09-07 DIAGNOSIS — Z6841 Body Mass Index (BMI) 40.0 and over, adult: Secondary | ICD-10-CM | POA: Diagnosis not present

## 2015-09-07 DIAGNOSIS — E611 Iron deficiency: Secondary | ICD-10-CM | POA: Diagnosis present

## 2015-09-07 LAB — PROTEIN / CREATININE RATIO, URINE
Creatinine, Urine: 224 mg/dL
Protein Creatinine Ratio: 0.08 mg/mg{Cre} (ref 0.00–0.15)
Total Protein, Urine: 19 mg/dL

## 2015-09-07 LAB — COMPREHENSIVE METABOLIC PANEL
ALT: 12 U/L — ABNORMAL LOW (ref 14–54)
ANION GAP: 7 (ref 5–15)
AST: 18 U/L (ref 15–41)
Albumin: 2.7 g/dL — ABNORMAL LOW (ref 3.5–5.0)
Alkaline Phosphatase: 181 U/L — ABNORMAL HIGH (ref 38–126)
BUN: 10 mg/dL (ref 6–20)
CALCIUM: 8.1 mg/dL — AB (ref 8.9–10.3)
CHLORIDE: 105 mmol/L (ref 101–111)
CO2: 22 mmol/L (ref 22–32)
CREATININE: 0.64 mg/dL (ref 0.44–1.00)
GFR calc non Af Amer: >60 mL/min (ref >60)
Glucose, Bld: 82 mg/dL (ref 65–99)
POTASSIUM: 3.7 mmol/L (ref 3.5–5.1)
SODIUM: 134 mmol/L — AB (ref 135–145)
Total Bilirubin: 0.4 mg/dL (ref 0.3–1.2)
Total Protein: 6.6 g/dL (ref 6.5–8.1)

## 2015-09-07 LAB — RPR: RPR Ser Ql: NONREACTIVE

## 2015-09-07 LAB — TYPE AND SCREEN
ABO/RH(D): O POS
ANTIBODY SCREEN: NEGATIVE

## 2015-09-07 LAB — CBC
HCT: 32.5 % — ABNORMAL LOW (ref 36.0–46.0)
Hemoglobin: 10 g/dL — ABNORMAL LOW (ref 12.0–15.0)
MCH: 21.9 pg — AB (ref 26.0–34.0)
MCHC: 30.8 g/dL (ref 30.0–36.0)
MCV: 71.1 fL — ABNORMAL LOW (ref 78.0–100.0)
PLATELETS: 178 10*3/uL (ref 150–400)
RBC: 4.57 MIL/uL (ref 3.87–5.11)
RDW: 16.2 % — AB (ref 11.5–15.5)
WBC: 8.7 10*3/uL (ref 4.0–10.5)

## 2015-09-07 LAB — URIC ACID: Uric Acid, Serum: 3.6 mg/dL (ref 2.3–6.6)

## 2015-09-07 MED ORDER — ACETAMINOPHEN 325 MG PO TABS: 650.0000 mg | ORAL_TABLET | ORAL | Status: DC | PRN

## 2015-09-07 MED ORDER — SIMETHICONE 80 MG PO CHEW: 80.0000 mg | CHEWABLE_TABLET | ORAL | Status: DC | PRN

## 2015-09-07 MED ORDER — ONDANSETRON HCL 4 MG PO TABS: 4.0000 mg | ORAL_TABLET | ORAL | Status: DC | PRN

## 2015-09-07 MED ORDER — ONDANSETRON HCL 4 MG/2ML IJ SOLN: 4.0000 mg | INTRAMUSCULAR | Status: DC | PRN

## 2015-09-07 MED ORDER — TERBUTALINE SULFATE 1 MG/ML IJ SOLN
0.2500 mg | Freq: Once | INTRAMUSCULAR | Status: DC | PRN
  Filled 2015-09-07: qty 1

## 2015-09-07 MED ORDER — FENTANYL 2.5 MCG/ML BUPIVACAINE 1/10 % EPIDURAL INFUSION (WH - ANES)
INTRAMUSCULAR | Status: DC | PRN
  Administered 2015-09-07: 14 mL/h via EPIDURAL

## 2015-09-07 MED ORDER — BUTORPHANOL TARTRATE 1 MG/ML IJ SOLN
1.0000 mg | INTRAMUSCULAR | Status: DC | PRN
  Administered 2015-09-07 (×2): 1 mg via INTRAVENOUS
  Filled 2015-09-07 (×2): qty 1

## 2015-09-07 MED ORDER — IBUPROFEN 600 MG PO TABS
600.0000 mg | ORAL_TABLET | Freq: Four times a day (QID) | ORAL | Status: DC
  Administered 2015-09-07 – 2015-09-09 (×8): 600 mg via ORAL
  Filled 2015-09-07 (×8): qty 1

## 2015-09-07 MED ORDER — ONDANSETRON HCL 4 MG/2ML IJ SOLN: 4.0000 mg | Freq: Four times a day (QID) | INTRAMUSCULAR | Status: DC | PRN

## 2015-09-07 MED ORDER — LANOLIN HYDROUS EX OINT: TOPICAL_OINTMENT | CUTANEOUS | Status: DC | PRN

## 2015-09-07 MED ORDER — DIPHENHYDRAMINE HCL 50 MG/ML IJ SOLN: 12.5000 mg | INTRAMUSCULAR | Status: DC | PRN

## 2015-09-07 MED ORDER — FENTANYL 2.5 MCG/ML BUPIVACAINE 1/10 % EPIDURAL INFUSION (WH - ANES)
14.0000 mL/h | INTRAMUSCULAR | Status: DC | PRN
  Filled 2015-09-07: qty 125

## 2015-09-07 MED ORDER — LIDOCAINE HCL (PF) 1 % IJ SOLN
INTRAMUSCULAR | Status: DC | PRN
  Administered 2015-09-07 (×2): 5 mL

## 2015-09-07 MED ORDER — OXYTOCIN BOLUS FROM INFUSION
500.0000 mL | INTRAVENOUS | Status: DC
Start: 2015-09-07 — End: 2015-09-07

## 2015-09-07 MED ORDER — OXYTOCIN 10 UNIT/ML IJ SOLN
1.0000 m[IU]/min | INTRAVENOUS | Status: DC
  Administered 2015-09-07: 2 m[IU]/min via INTRAVENOUS
  Filled 2015-09-07: qty 4

## 2015-09-07 MED ORDER — DIPHENHYDRAMINE HCL 25 MG PO CAPS: 25.0000 mg | ORAL_CAPSULE | Freq: Four times a day (QID) | ORAL | Status: DC | PRN

## 2015-09-07 MED ORDER — OXYTOCIN 10 UNIT/ML IJ SOLN: 2.5000 [IU]/h | INTRAMUSCULAR | Status: DC | PRN

## 2015-09-07 MED ORDER — OXYCODONE-ACETAMINOPHEN 5-325 MG PO TABS: 2.0000 | ORAL_TABLET | ORAL | Status: DC | PRN

## 2015-09-07 MED ORDER — PHENYLEPHRINE 40 MCG/ML (10ML) SYRINGE FOR IV PUSH (FOR BLOOD PRESSURE SUPPORT)
80.0000 ug | PREFILLED_SYRINGE | INTRAVENOUS | Status: DC | PRN
  Filled 2015-09-07: qty 2
  Filled 2015-09-07: qty 20

## 2015-09-07 MED ORDER — OXYCODONE-ACETAMINOPHEN 5-325 MG PO TABS: 1.0000 | ORAL_TABLET | ORAL | Status: DC | PRN

## 2015-09-07 MED ORDER — LACTATED RINGERS IV SOLN: 500.0000 mL | INTRAVENOUS | Status: DC | PRN

## 2015-09-07 MED ORDER — PRENATAL MULTIVITAMIN CH
1.0000 | ORAL_TABLET | Freq: Every day | ORAL | Status: DC
  Administered 2015-09-08 – 2015-09-09 (×2): 1 via ORAL
  Filled 2015-09-07 (×2): qty 1

## 2015-09-07 MED ORDER — TETANUS-DIPHTH-ACELL PERTUSSIS 5-2.5-18.5 LF-MCG/0.5 IM SUSP
0.5000 mL | Freq: Once | INTRAMUSCULAR | Status: AC
  Administered 2015-09-08: 0.5 mL via INTRAMUSCULAR
  Filled 2015-09-07: qty 0.5

## 2015-09-07 MED ORDER — PENICILLIN G POTASSIUM 5000000 UNITS IJ SOLR
2.5000 10*6.[IU] | INTRAMUSCULAR | Status: DC
  Administered 2015-09-07 (×2): 2.5 10*6.[IU] via INTRAVENOUS
  Filled 2015-09-07 (×4): qty 2.5

## 2015-09-07 MED ORDER — OXYTOCIN 10 UNIT/ML IJ SOLN
2.5000 [IU]/h | INTRAVENOUS | Status: DC
  Administered 2015-09-07: 11:00:00 via INTRAVENOUS

## 2015-09-07 MED ORDER — FENTANYL 2.5 MCG/ML BUPIVACAINE 1/10 % EPIDURAL INFUSION (WH - ANES)
14.0000 mL/h | INTRAMUSCULAR | Status: DC | PRN
Start: 2015-09-07 — End: 2015-09-07

## 2015-09-07 MED ORDER — BENZOCAINE-MENTHOL 20-0.5 % EX AERO: 1.0000 "application " | INHALATION_SPRAY | CUTANEOUS | Status: DC | PRN

## 2015-09-07 MED ORDER — CITRIC ACID-SODIUM CITRATE 334-500 MG/5ML PO SOLN: 30.0000 mL | ORAL | Status: DC | PRN

## 2015-09-07 MED ORDER — DIBUCAINE 1 % RE OINT: 1.0000 "application " | TOPICAL_OINTMENT | RECTAL | Status: DC | PRN

## 2015-09-07 MED ORDER — ZOLPIDEM TARTRATE 5 MG PO TABS: 5.0000 mg | ORAL_TABLET | Freq: Every evening | ORAL | Status: DC | PRN

## 2015-09-07 MED ORDER — LIDOCAINE HCL (PF) 1 % IJ SOLN
30.0000 mL | INTRAMUSCULAR | Status: DC | PRN
  Filled 2015-09-07: qty 30

## 2015-09-07 MED ORDER — INFLUENZA VAC SPLIT QUAD 0.5 ML IM SUSY
0.5000 mL | PREFILLED_SYRINGE | INTRAMUSCULAR | Status: AC
  Administered 2015-09-08: 0.5 mL via INTRAMUSCULAR

## 2015-09-07 MED ORDER — LACTATED RINGERS IV SOLN: 1.0000 m[IU]/min | INTRAVENOUS | Status: DC

## 2015-09-07 MED ORDER — FLEET ENEMA 7-19 GM/118ML RE ENEM: 1.0000 | ENEMA | RECTAL | Status: DC | PRN

## 2015-09-07 MED ORDER — OXYTOCIN 10 UNIT/ML IJ SOLN: 1.0000 m[IU]/min | INTRAVENOUS | Status: DC

## 2015-09-07 MED ORDER — LACTATED RINGERS IV SOLN
INTRAVENOUS | Status: DC
  Administered 2015-09-07: 07:00:00 via INTRAVENOUS

## 2015-09-07 MED ORDER — EPHEDRINE 5 MG/ML INJ
10.0000 mg | INTRAVENOUS | Status: DC | PRN
  Filled 2015-09-07: qty 2

## 2015-09-07 MED ORDER — PENICILLIN G POTASSIUM 5000000 UNITS IJ SOLR
5.0000 10*6.[IU] | Freq: Once | INTRAMUSCULAR | Status: AC
  Administered 2015-09-07: 5 10*6.[IU] via INTRAVENOUS
  Filled 2015-09-07: qty 5

## 2015-09-07 MED ORDER — WITCH HAZEL-GLYCERIN EX PADS: 1.0000 "application " | MEDICATED_PAD | CUTANEOUS | Status: DC | PRN

## 2015-09-07 MED ORDER — SENNOSIDES-DOCUSATE SODIUM 8.6-50 MG PO TABS
2.0000 | ORAL_TABLET | ORAL | Status: DC
  Administered 2015-09-07 – 2015-09-09 (×2): 2 via ORAL
  Filled 2015-09-07 (×2): qty 2

## 2015-09-07 NOTE — Anesthesia Procedure Notes (Signed)
Epidural Patient location during procedure: OB Start time: 09/07/2015 8:05 AM End time: 09/07/2015 8:18 AM  Staffing Anesthesiologist: Sebastian AcheMANNY, Gian Ybarra  Preanesthetic Checklist Completed: patient identified, site marked, surgical consent, pre-op evaluation, timeout performed, IV checked, risks and benefits discussed and monitors and equipment checked  Epidural Patient position: sitting Prep: DuraPrep Patient monitoring: heart rate, continuous pulse ox and blood pressure Approach: midline Location: L4-L5 Injection technique: LOR air  Needle:  Needle type: Tuohy  Needle gauge: 18 G Needle length: 9 cm and 9 Needle insertion depth: 7.5 cm Catheter type: closed end flexible Catheter size: 20 Guage Catheter at skin depth: 15 cm Test dose: 1.5% lidocaine with Epi 1:200 K  Assessment Events: blood not aspirated, injection not painful, no injection resistance, negative IV test and no paresthesia  Additional Notes   Patient tolerated the insertion well without complications.Reason for block:procedure for pain

## 2015-09-07 NOTE — Anesthesia Postprocedure Evaluation (Signed)
Anesthesia Post Note  Patient: Lisa Garza  Procedure(s) Performed: * No procedures listed *  Patient location during evaluation: Mother Baby Anesthesia Type: Epidural Level of consciousness: awake and alert Pain management: pain level controlled Vital Signs Assessment: post-procedure vital signs reviewed and stable Respiratory status: spontaneous breathing Cardiovascular status: stable Postop Assessment: no headache, no backache, no signs of nausea or vomiting and adequate PO intake Anesthetic complications: no    Last Vitals:  Filed Vitals:   09/07/15 1200 09/07/15 1258  BP: 118/65 111/62  Pulse: 65 68  Temp: 36.9 C 36.8 C  Resp: 16 16    Last Pain:  Filed Vitals:   09/07/15 1505  PainSc: 0-No pain                 Osborne Serio Hristova

## 2015-09-07 NOTE — Progress Notes (Signed)
Delivery of live viable female by Franklyn Lorochelle Denney, CNM APGARS 8,9

## 2015-09-07 NOTE — Anesthesia Preprocedure Evaluation (Signed)
Anesthesia Evaluation  Patient identified by MRN, date of birth, ID band Patient awake and Patient confused    Reviewed: Allergy & Precautions, H&P , NPO status , Patient's Chart, lab work & pertinent test results  Airway Mallampati: II       Dental   Pulmonary    Pulmonary exam normal breath sounds clear to auscultation       Cardiovascular Exercise Tolerance: Good Normal cardiovascular exam Rhythm:regular Rate:Normal     Neuro/Psych    GI/Hepatic   Endo/Other  Morbid obesity  Renal/GU      Musculoskeletal   Abdominal   Peds  Hematology   Anesthesia Other Findings   Reproductive/Obstetrics (+) Pregnancy                             Anesthesia Physical Anesthesia Plan  ASA: III  Anesthesia Plan: Epidural   Post-op Pain Management:    Induction:   Airway Management Planned:   Additional Equipment:   Intra-op Plan:   Post-operative Plan:   Informed Consent: I have reviewed the patients History and Physical, chart, labs and discussed the procedure including the risks, benefits and alternatives for the proposed anesthesia with the patient or authorized representative who has indicated his/her understanding and acceptance.     Plan Discussed with:   Anesthesia Plan Comments:         Anesthesia Quick Evaluation  

## 2015-09-07 NOTE — H&P (Signed)
Bobetta LimeSumarra Illes is a 20 y.o. female presenting for rupture of membranes. Maternal Medical History:  Reason for admission: Rupture of membranes.   Fetal activity: Perceived fetal activity is normal.   Last perceived fetal movement was within the past hour.    Prenatal complications: no prenatal complications Prenatal Complications - Diabetes: none.    OB History    Gravida Para Term Preterm AB TAB SAB Ectopic Multiple Living   2    1  1    0     Past Medical History  Diagnosis Date  . Anemia    Past Surgical History  Procedure Laterality Date  . No past surgeries     Family History: family history includes Hypertension in her mother. Social History:  reports that she has never smoked. She does not have any smokeless tobacco history on file. She reports that she does not drink alcohol or use illicit drugs.   Prenatal Transfer Tool  Maternal Diabetes: No Genetic Screening: Normal Maternal Ultrasounds/Referrals: Normal Fetal Ultrasounds or other Referrals:  None Maternal Substance Abuse:  No Significant Maternal Medications:  None Significant Maternal Lab Results:  None Other Comments:  None  Review of Systems  All other systems reviewed and are negative.   Dilation: 2 Effacement (%): 50 Station: -2 Exam by:: Orinda KennerKimberly Whitehurst Boyd RN Blood pressure 143/63, pulse 69, temperature 98.1 F (36.7 C), temperature source Oral, resp. rate 16, height 5\' 8"  (1.727 m), weight 275 lb (124.739 kg), last menstrual period 12/20/2014, SpO2 97 %. Maternal Exam:  Uterine Assessment: Contraction frequency is irregular.   Abdomen: Patient reports no abdominal tenderness. Fetal presentation: vertex  Introitus: Normal vulva. Normal vagina.  Amniotic fluid character: clear.  Cervix: Cervix evaluated by digital exam.     Physical Exam  Nursing note and vitals reviewed. Constitutional: She is oriented to person, place, and time. She appears well-developed and well-nourished.   HENT:  Head: Normocephalic and atraumatic.  Eyes: Conjunctivae are normal. Pupils are equal, round, and reactive to light.  Neck: Normal range of motion. Neck supple.  Cardiovascular: Normal rate and regular rhythm.   Respiratory: Effort normal and breath sounds normal.  GI: Soft.  Genitourinary: Vagina normal and uterus normal.  Musculoskeletal: Normal range of motion.  Neurological: She is alert and oriented to person, place, and time.  Skin: Skin is warm and dry.  Psychiatric: She has a normal mood and affect. Her behavior is normal. Judgment and thought content normal.    Prenatal labs: ABO, Rh: --/--/O POS (01/11 0005) Antibody: NEG (01/11 0005) Rubella: 2.75 (08/23 1515) RPR: NON REAC (11/01 1043)  HBsAg: NEGATIVE (08/23 1515)  HIV: NONREACTIVE (11/01 1043)  GBS: DETECTED (12/27 1707)   Assessment/Plan: 38 weeks.  SROM.  Admit.   HARPER,CHARLES A 09/07/2015, 1:26 AM

## 2015-09-08 LAB — CBC
HEMATOCRIT: 29.9 % — AB (ref 36.0–46.0)
Hemoglobin: 9.1 g/dL — ABNORMAL LOW (ref 12.0–15.0)
MCH: 21.6 pg — ABNORMAL LOW (ref 26.0–34.0)
MCHC: 30.4 g/dL (ref 30.0–36.0)
MCV: 71 fL — ABNORMAL LOW (ref 78.0–100.0)
PLATELETS: 170 10*3/uL (ref 150–400)
RBC: 4.21 MIL/uL (ref 3.87–5.11)
RDW: 16.3 % — AB (ref 11.5–15.5)
WBC: 8.4 10*3/uL (ref 4.0–10.5)

## 2015-09-08 MED ORDER — FERROUS SULFATE 325 (65 FE) MG PO TABS
325.0000 mg | ORAL_TABLET | Freq: Three times a day (TID) | ORAL | Status: DC
  Administered 2015-09-08 – 2015-09-09 (×4): 325 mg via ORAL
  Filled 2015-09-08 (×4): qty 1

## 2015-09-08 NOTE — Progress Notes (Signed)
Post Partum Day #1 Subjective: no complaints, up ad lib, voiding and tolerating PO. Is bottle feeding.  Has a hx of bilateral nipple piercing's that she cannot remove until the engorgement goes down, therefore cannot breastfeed currently.    Objective: Blood pressure 106/51, pulse 63, temperature 98.5 F (36.9 C), temperature source Oral, resp. rate 18, height 5\' 8"  (1.727 m), weight 274 lb (124.286 kg), last menstrual period 12/20/2014, SpO2 100 %, unknown if currently breastfeeding.  Physical Exam:  General: alert, cooperative and no distress Lochia: appropriate Uterine Fundus: firm Incision: none DVT Evaluation: No evidence of DVT seen on physical exam. No cords or calf tenderness. No significant calf/ankle edema.   Recent Labs  09/07/15 0005 09/08/15 0535  HGB 10.0* 9.1*  HCT 32.5* 29.9*    Assessment/Plan: Plan for discharge tomorrow. Anemia.    LOS: 1 day   Makailee Nudelman A Kaileena Obi 09/08/2015, 11:38 AM

## 2015-09-09 MED ORDER — IBUPROFEN 600 MG PO TABS: 600.0000 mg | ORAL_TABLET | Freq: Four times a day (QID) | ORAL | Status: AC | PRN

## 2015-09-09 MED ORDER — OXYCODONE-ACETAMINOPHEN 5-325 MG PO TABS: 1.0000 | ORAL_TABLET | ORAL | Status: DC | PRN

## 2015-09-09 MED ORDER — PUREFE PLUS 106-1 MG PO CAPS: 1.0000 | ORAL_CAPSULE | Freq: Every day | ORAL | Status: AC

## 2015-09-09 NOTE — Progress Notes (Signed)
Post Partum Day 2 Subjective: no complaints  Objective: Blood pressure 110/70, pulse 70, temperature 97.8 F (36.6 C), temperature source Oral, resp. rate 16, height 5\' 8"  (1.727 m), weight 274 lb (124.286 kg), last menstrual period 12/20/2014, SpO2 100 %, unknown if currently breastfeeding.  Physical Exam:  General: alert and no distress Lochia: appropriate Uterine Fundus: firm Incision: none DVT Evaluation: No evidence of DVT seen on physical exam.   Recent Labs  09/07/15 0005 09/08/15 0535  HGB 10.0* 9.1*  HCT 32.5* 29.9*    Assessment/Plan: Doing well.  Mild anemia.  Chronic iron deficiency.  Iron Rx. Discharge home   LOS: 2 days   Siah Kannan A 09/09/2015, 8:38 AM

## 2015-09-09 NOTE — Progress Notes (Signed)
Discharge papers and instructions completed with patient. Denies questions at this time. Patient stated her transportation will arrive around 5pm.

## 2015-09-09 NOTE — Discharge Summary (Signed)
Obstetric Discharge Summary Reason for Admission: rupture of membranes Prenatal Procedures: ultrasound Intrapartum Procedures: spontaneous vaginal delivery Postpartum Procedures: none Complications-Operative and Postpartum: none HEMOGLOBIN  Date Value Ref Range Status  09/08/2015 9.1* 12.0 - 15.0 g/dL Final   HCT  Date Value Ref Range Status  09/08/2015 29.9* 36.0 - 46.0 % Final    Physical Exam:  General: alert and no distress Lochia: appropriate Uterine Fundus: firm Incision: none DVT Evaluation: No evidence of DVT seen on physical exam.  Discharge Diagnoses: Term Pregnancy-delivered  Discharge Information: Date: 09/09/2015 Activity: pelvic rest Diet: routine Medications: PNV, Ibuprofen, Colace, Iron and Percocet Condition: stable Instructions: refer to practice specific booklet Discharge to: home Follow-up Information    Follow up with Roe Coombsachelle A Denney, CNM In 2 weeks.   Specialty:  Certified Nurse Midwife   Contact information:   [address] [place] [id number] [phone]       Newborn Data: Live born female  Birth Weight: 6 lb 4 oz (2835 g) APGAR: 8, 9  Home with mother.  Joash Tony A 09/09/2015, 8:48 AM

## 2015-09-12 ENCOUNTER — Encounter (HOSPITAL_COMMUNITY): Payer: Self-pay | Admitting: Anesthesiology

## 2015-09-14 ENCOUNTER — Other Ambulatory Visit: Payer: Self-pay | Admitting: Certified Nurse Midwife

## 2015-09-14 ENCOUNTER — Encounter: Payer: PRIVATE HEALTH INSURANCE | Admitting: Certified Nurse Midwife

## 2015-09-14 DIAGNOSIS — D520 Dietary folate deficiency anemia: Secondary | ICD-10-CM

## 2015-09-14 MED ORDER — FUSION PLUS PO CAPS: 1.0000 | ORAL_CAPSULE | Freq: Every day | ORAL | Status: AC

## 2015-09-21 ENCOUNTER — Encounter: Payer: Self-pay | Admitting: Certified Nurse Midwife

## 2015-09-21 ENCOUNTER — Ambulatory Visit (INDEPENDENT_AMBULATORY_CARE_PROVIDER_SITE_OTHER): Payer: Medicaid Other | Admitting: Certified Nurse Midwife

## 2015-09-21 DIAGNOSIS — Z029 Encounter for administrative examinations, unspecified: Secondary | ICD-10-CM

## 2015-09-21 NOTE — Progress Notes (Signed)
Patient ID: Lisa Garza, female   DOB: April 09, 1996, 20 y.o.   MRN: [id number]  Subjective:     Lisa Garza is a 20 y.o. female who presents for a postpartum visit. She is 2 weeks postpartum following a spontaneous vaginal delivery. I have fully reviewed the prenatal and intrapartum course. The delivery was at 40 gestational weeks. Outcome: spontaneous vaginal delivery. Anesthesia: epidural. Postpartum course has been normal. Baby's course has been normal. Baby is feeding by breast. Bleeding thin lochia and red. Bowel function is normal. Bladder function is normal. Patient is not sexually active. Contraception method is abstinence. Postpartum depression screening: negative.  Tobacco, alcohol and substance abuse history reviewed.  Adult immunizations reviewed including TDAP, rubella and varicella. Encouraged patient not to over due it, patient has not been sleeping according to the significant other.    The following portions of the patient's history were reviewed and updated as appropriate: allergies, current medications, past family history, past medical history, past social history, past surgical history and problem list.  Review of Systems Pertinent items noted in HPI and remainder of comprehensive ROS otherwise negative.   Objective:    BP 121/83 mmHg  Pulse 63  Temp(Src) 98.3 F (36.8 C)  Wt 254 lb (115.214 kg)  Breastfeeding? No  General:  alert, cooperative and no distress   Breasts:  inspection negative, no nipple discharge or bleeding, no masses or nodularity palpable  Lungs: clear to auscultation bilaterally  Heart:  regular rate and rhythm, S1, S2 normal, no murmur, click, rub or gallop  Abdomen: soft, non-tender; bowel sounds normal; no masses,  no organomegaly   Vulva:  not evaluated  Vagina: not evaluated  Cervix:  not evaluated  Corpus: not examined  Adnexa:  not evaluated  Rectal Exam: Not performed.          50% of 15 min visit spent on counseling and  coordination of care.  Assessment:     Normal 2 week postpartum exam. Pap smear not done at today's visit.    Contraception counseling   Plan:    1. Contraception: abstinence 2. F/U with Mirena IUD in 4 weeks.  3. FMLA paperwork completed 4. Follow up in: 4 weeks or as needed.  2hr GTT for h/o GDM/screening for DM q 3 yrs per ADA recommendations Preconception counseling provided Healthy lifestyle practices reviewed

## 2015-10-05 ENCOUNTER — Ambulatory Visit: Payer: PRIVATE HEALTH INSURANCE | Admitting: Certified Nurse Midwife

## 2015-10-19 ENCOUNTER — Ambulatory Visit: Payer: 59 | Admitting: Certified Nurse Midwife

## 2015-10-21 ENCOUNTER — Telehealth: Payer: Self-pay | Admitting: *Deleted

## 2015-10-21 NOTE — Telephone Encounter (Signed)
Lisa Garza contacted office to reschedule her MIrena IUD insertion. Lisa Garza has not resumed intercourse since the delivery of her baby 6 weeks ago. Lisa Garza advised to continue to abstain until after the insertion of the device. Lisa Garza has been rescheduled for her MIrena IUD insertion on 10-26-15.

## 2015-10-26 ENCOUNTER — Ambulatory Visit (INDEPENDENT_AMBULATORY_CARE_PROVIDER_SITE_OTHER): Payer: Managed Care, Other (non HMO) | Admitting: Certified Nurse Midwife

## 2015-10-26 VITALS — BP 152/91 | HR 80

## 2015-10-26 DIAGNOSIS — Z30430 Encounter for insertion of intrauterine contraceptive device: Secondary | ICD-10-CM | POA: Insufficient documentation

## 2015-10-26 DIAGNOSIS — Z30014 Encounter for initial prescription of intrauterine contraceptive device: Secondary | ICD-10-CM

## 2015-10-26 DIAGNOSIS — Z01818 Encounter for other preprocedural examination: Secondary | ICD-10-CM

## 2015-10-26 NOTE — Progress Notes (Signed)
Patient ID: Lisa Garza, female   DOB: [birth year], 20 y.o.   MRN: [id number]  IUD Procedure Note   DIAGNOSIS: Desires long-term, reversible contraception   PROCEDURE: IUD placement Performing Provider: Orvilla Cornwall CNM  Patient counseled prior to procedure. I explained risks and benefits of Mirena IUD, reviewed alternative forms of contraception. Patient stated understanding and consented to continue with procedure.   LMP: 10/19/15 Pregnancy Test: Negative Lot #: [id number] Expiration Date: 05/19   IUD type:  Mirena   [  ] Paraguard  [  ] Christean Grief   [  ]  Kyleena  PROCEDURE:  Timeout procedure was performed to ensure right patient and right site.  A bimanual exam was performed to determine the position of the uterus, retroverted. The speculum was placed. The vagina and cervix was sterilized in the usual manner and sterile technique was maintained throughout the course of the procedure. A single toothed tenaculum was not required. The depth of the uterus was sounded to 10 cm . The IUD was inserted to the appropriate depth and inserted without difficulty.  The string was cut to an estimated 4 cm length. Bleeding was minimal. The patient tolerated the procedure well.   Follow up: The patient tolerated the procedure well without complications.  Standard post-procedure care is explained and return precautions are given.  Orvilla Cornwall CNM

## 2015-10-26 NOTE — Addendum Note (Signed)
Addended by: Marya Landry D on: 10/26/2015 04:28 PM   Modules accepted: Orders

## 2015-10-30 LAB — SURESWAB, VAGINOSIS/VAGINITIS PLUS
ATOPOBIUM VAGINAE: 5.9 Log (cells/mL)
C. TROPICALIS, DNA: NOT DETECTED
C. albicans, DNA: NOT DETECTED
C. glabrata, DNA: NOT DETECTED
C. parapsilosis, DNA: NOT DETECTED
C. trachomatis RNA, TMA: NOT DETECTED
GARDNERELLA VAGINALIS: 7.8 Log (cells/mL)
LACTOBACILLUS SPECIES: NOT DETECTED Log (cells/mL)
MEGASPHAERA SPECIES: 7.3 Log (cells/mL)
N. gonorrhoeae RNA, TMA: NOT DETECTED
T. VAGINALIS RNA, QL TMA: NOT DETECTED

## 2015-11-02 ENCOUNTER — Other Ambulatory Visit: Payer: Self-pay | Admitting: Certified Nurse Midwife

## 2015-11-02 DIAGNOSIS — N760 Acute vaginitis: Principal | ICD-10-CM

## 2015-11-02 DIAGNOSIS — B9689 Other specified bacterial agents as the cause of diseases classified elsewhere: Secondary | ICD-10-CM

## 2015-11-02 MED ORDER — TINIDAZOLE 500 MG PO TABS: 2.0000 g | ORAL_TABLET | Freq: Every day | ORAL | Status: AC

## 2015-11-09 ENCOUNTER — Encounter: Payer: Self-pay | Admitting: *Deleted

## 2015-11-28 ENCOUNTER — Ambulatory Visit: Payer: PRIVATE HEALTH INSURANCE | Admitting: Obstetrics

## 2015-11-29 ENCOUNTER — Ambulatory Visit: Payer: PRIVATE HEALTH INSURANCE | Admitting: Certified Nurse Midwife

## 2015-12-02 IMAGING — US US OB COMP LESS 14 WK
1 series · 14 of 28 positions shown · non-contrast
Comparison: None.

CLINICAL DATA: Cramping since yesterday, no current bleeding,
gestational age by LMP 9 weeks and 2 days

EXAM:
OBSTETRIC <14 WK ULTRASOUND
TECHNIQUE: Transabdominal ultrasound was performed for evaluation of the
gestation as well as the maternal uterus and adnexal regions.

[Series 1: us ob comp less 14 wk · 14 of 29 slices shown]
[im 2/29]
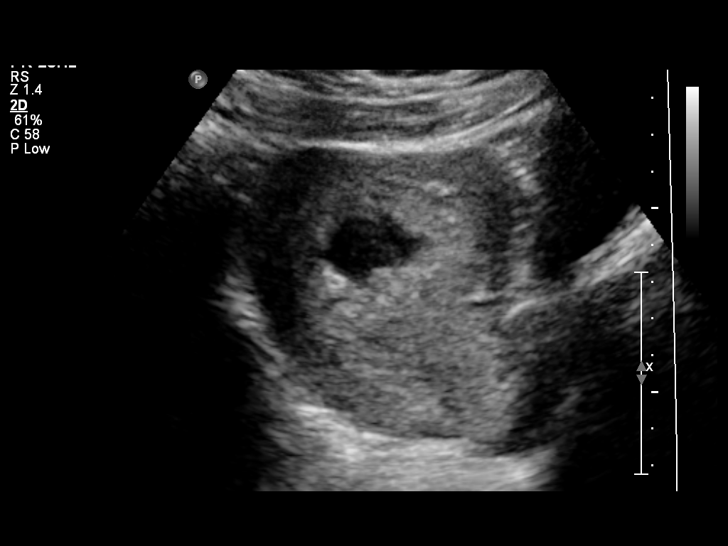
[im 4/29]
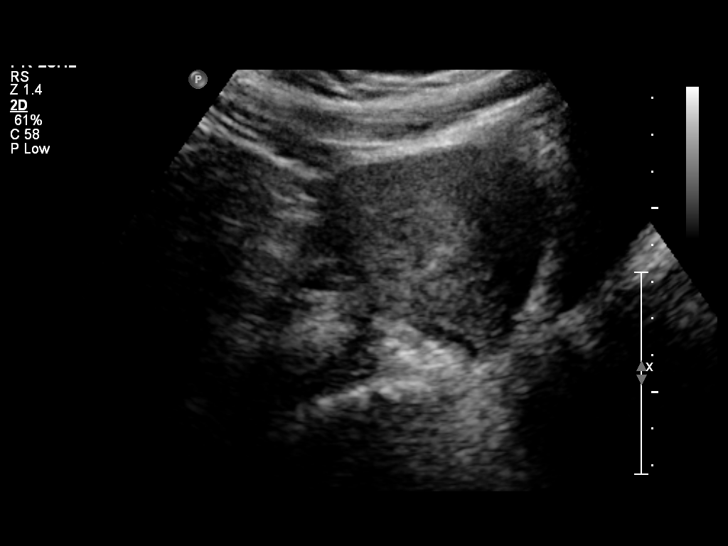
[im 6/29]
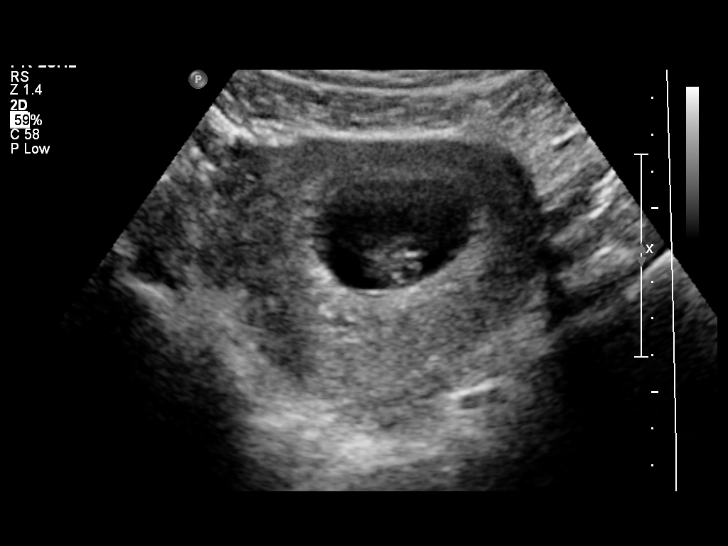
[im 8/29]
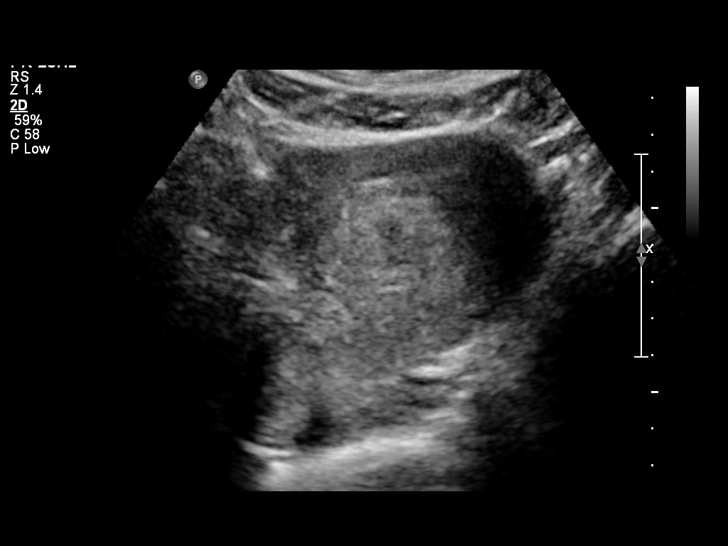
[im 10/29]
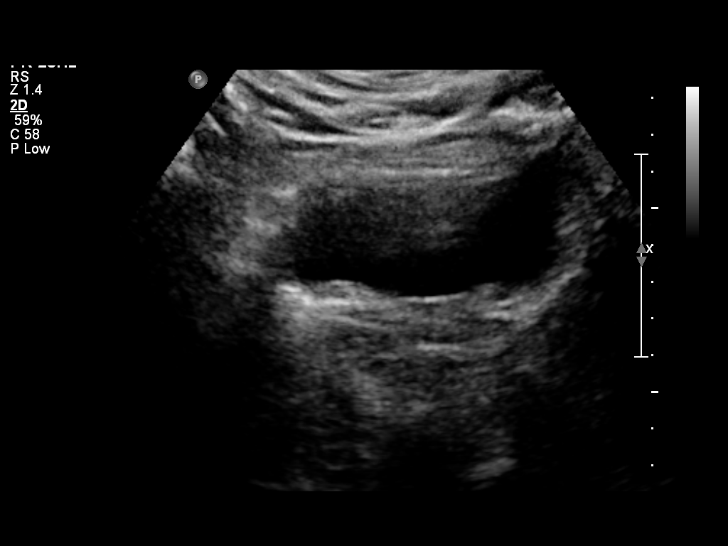
[im 12/29]
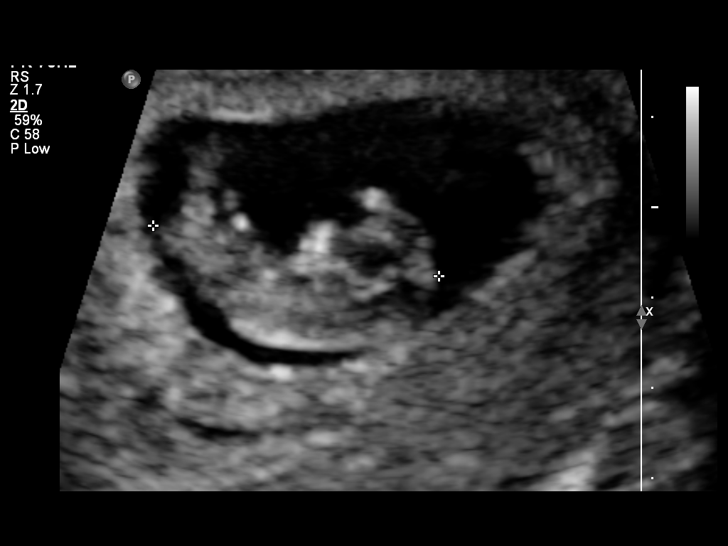
[im 14/29]
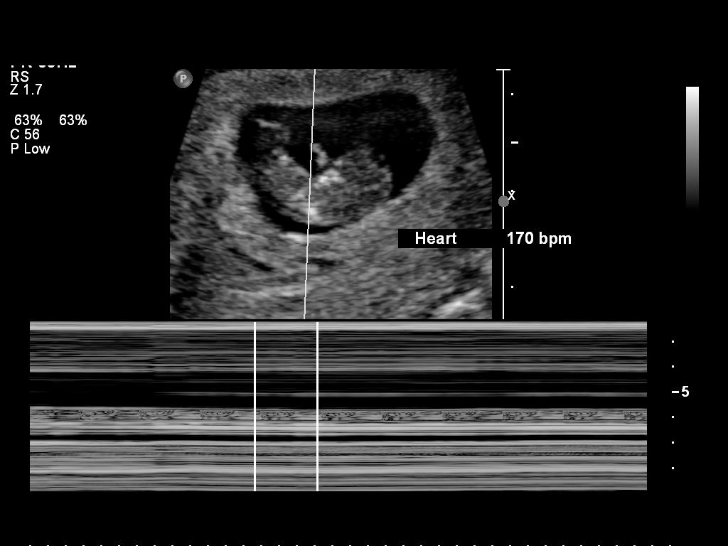
[im 16/29]
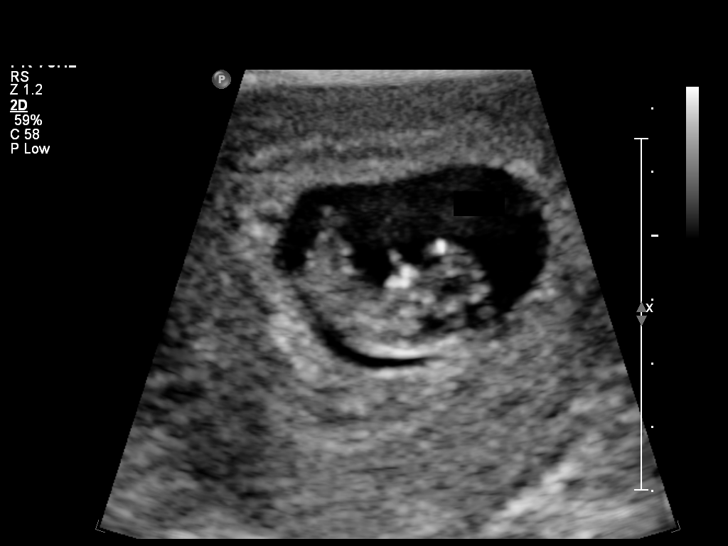
[im 18/29]
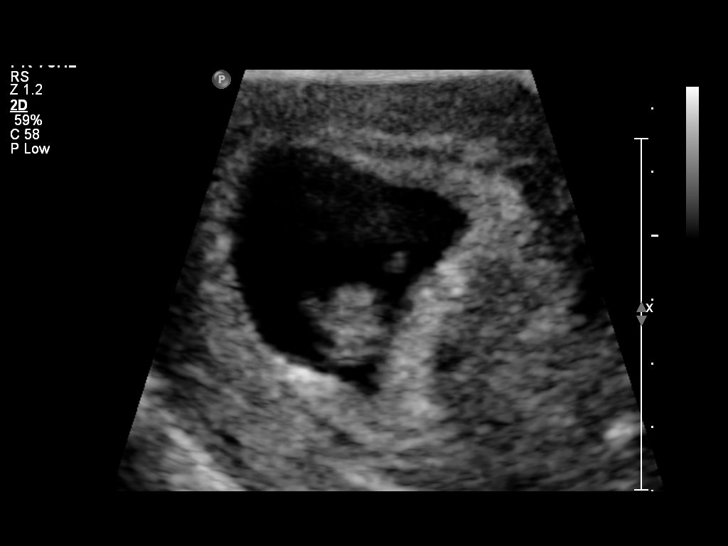
[im 20/29]
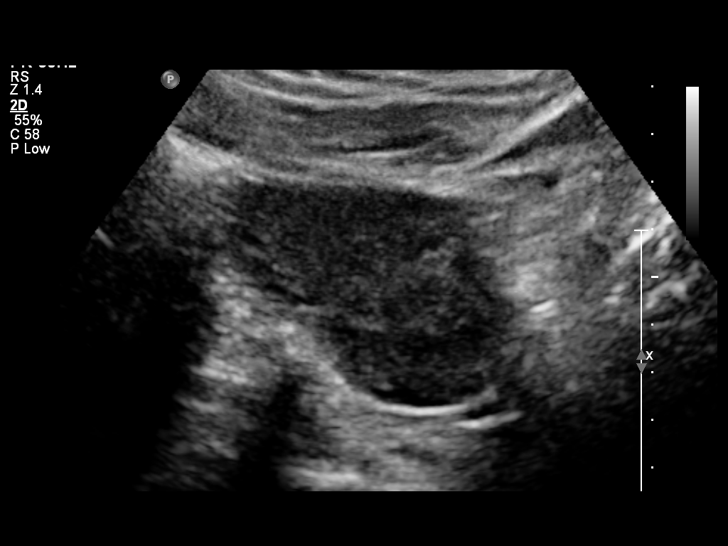
[im 22/29]
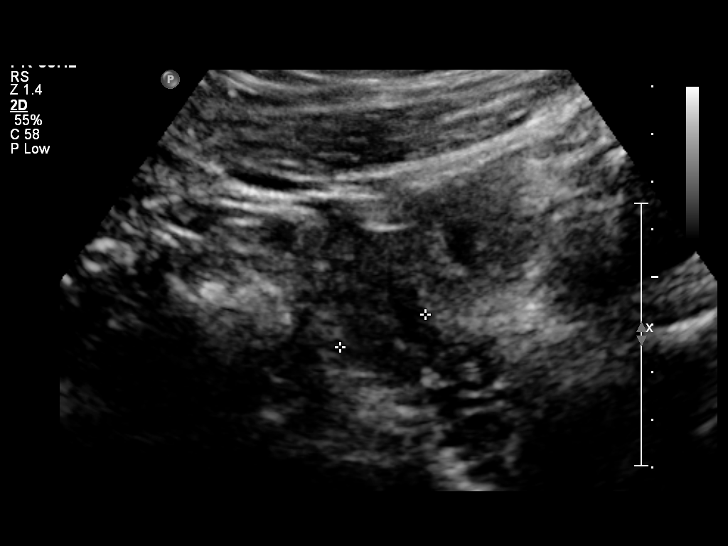
[im 24/29]
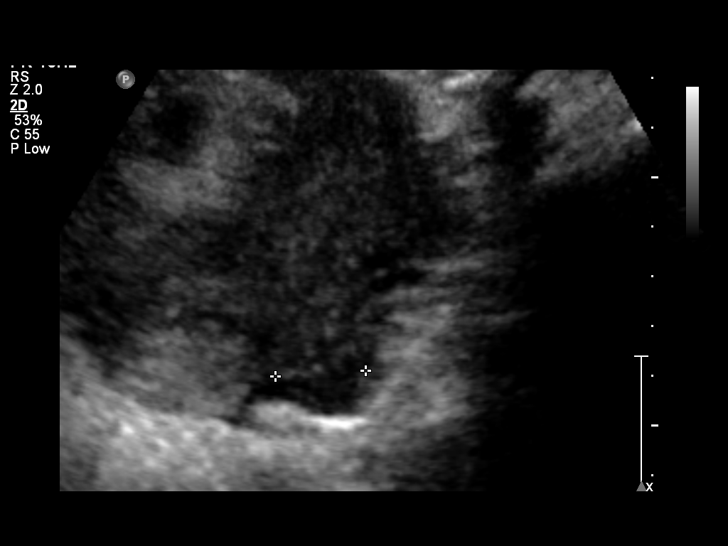
[im 26/29]
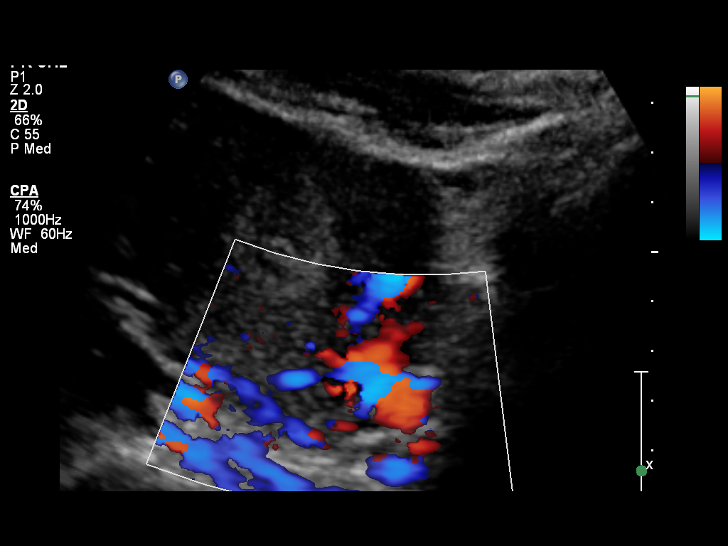
[im 29/29]
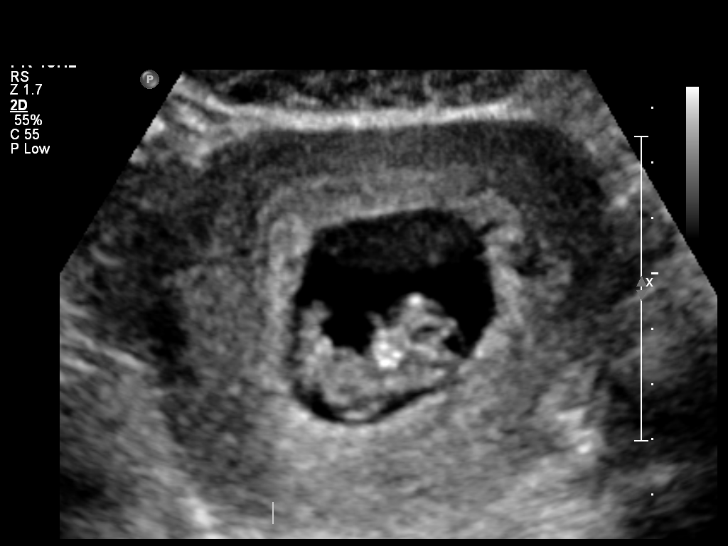

[14 of 28 positions shown; findings below may reference images not displayed]

FINDINGS: Intrauterine gestational sac: Visualized/normal in shape.

Yolk sac:  Not well visualized

Embryo:  Present

Cardiac Activity: Present

Heart Rate: 170 bpm

CRL:   30.9  mm   10 w 0 d                  US EDC: 09/21/2015

Maternal uterus/adnexae: No definitive subchorionic hemorrhage is
identified. The ovaries appear within normal limits. No free pelvic
fluid is noted.
IMPRESSION: Single live intrauterine gestation at 10 weeks which roughly
corresponds with the patient's given gestational dates. No acute
abnormality is noted.

## 2016-01-17 ENCOUNTER — Ambulatory Visit: Payer: PRIVATE HEALTH INSURANCE | Admitting: Certified Nurse Midwife

## 2016-05-23 IMAGING — US US MFM OB FOLLOW-UP
1 series · 14 of 28 positions shown · non-contrast
Comparison: none

[Series 1: us mfm ob follow-up · 56 acquisitions, 14 frames shown]
[im 3/56]
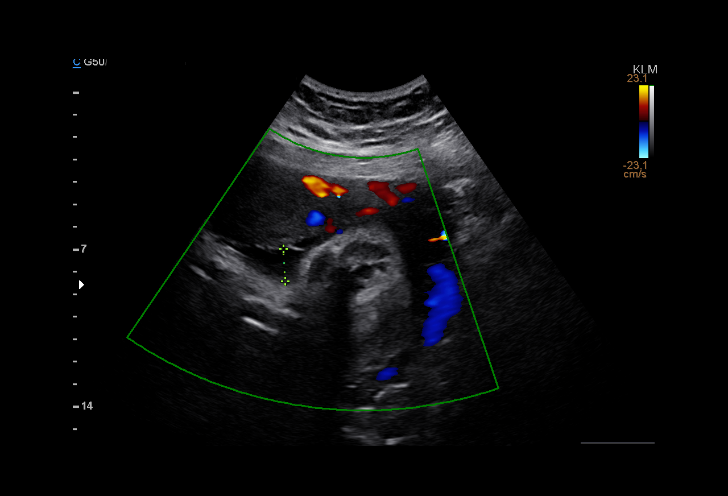
[im 7/56]
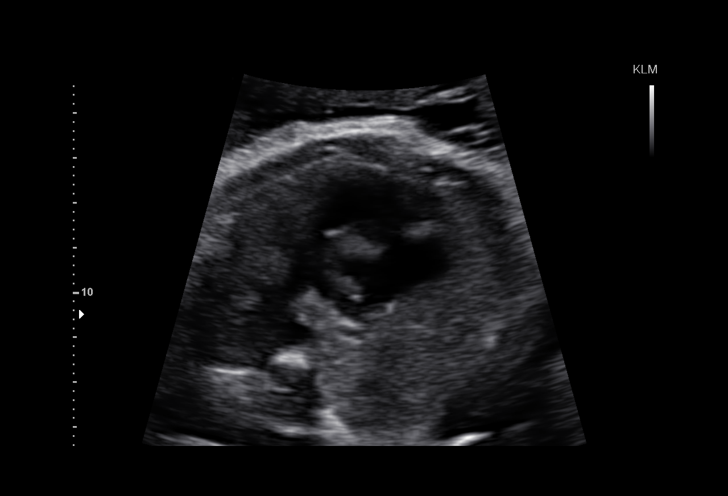
[im 11/56]
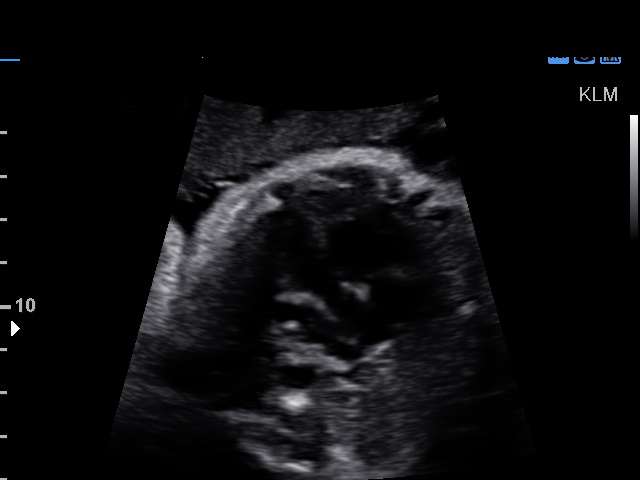
[im 15/56]
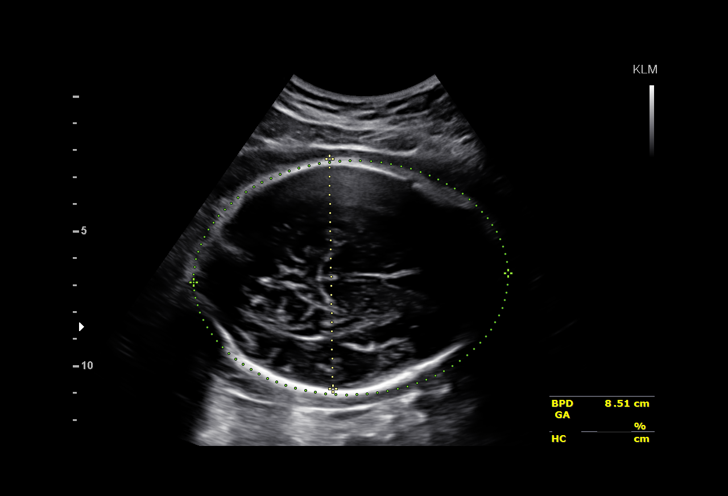
[im 19/56]
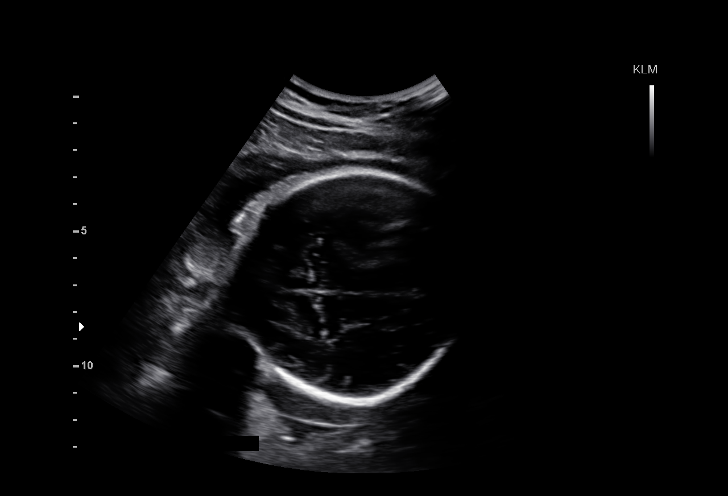
[im 23/56]
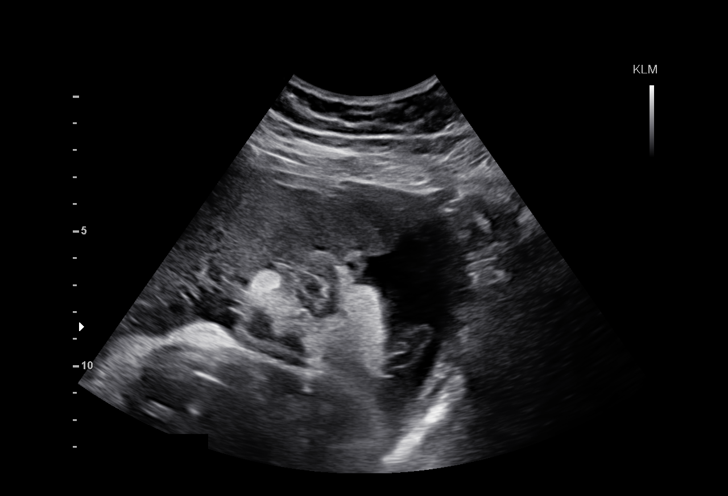
[im 27/56]
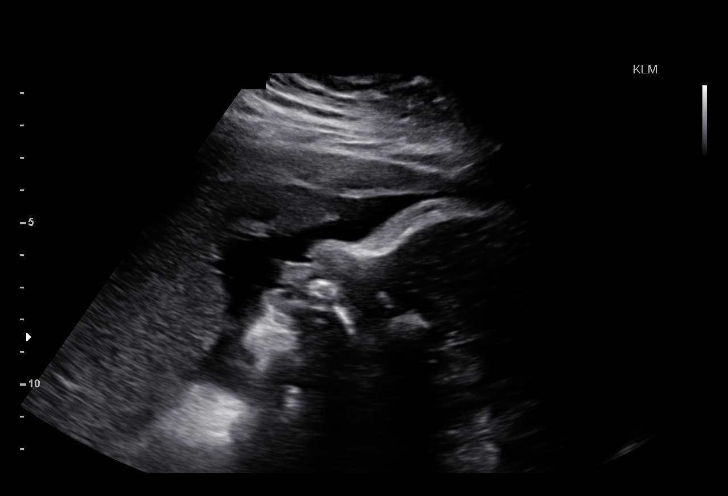
[im 31/56]
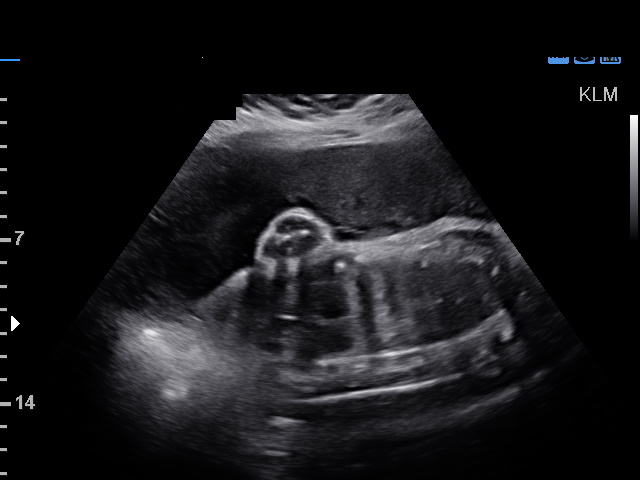
[im 35/56]
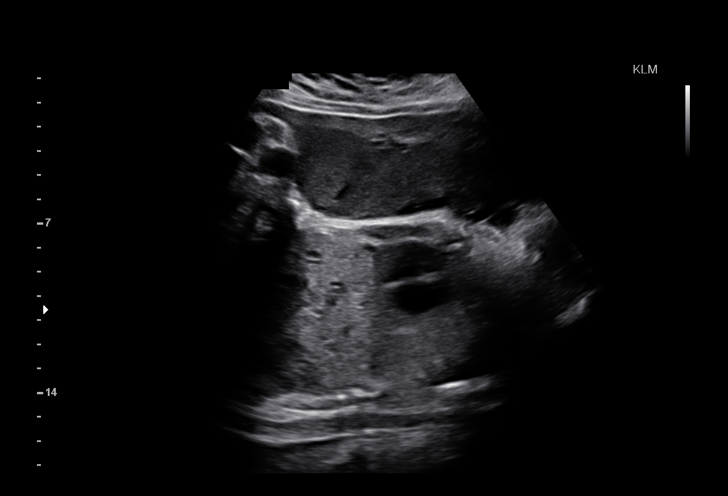
[im 39/56]
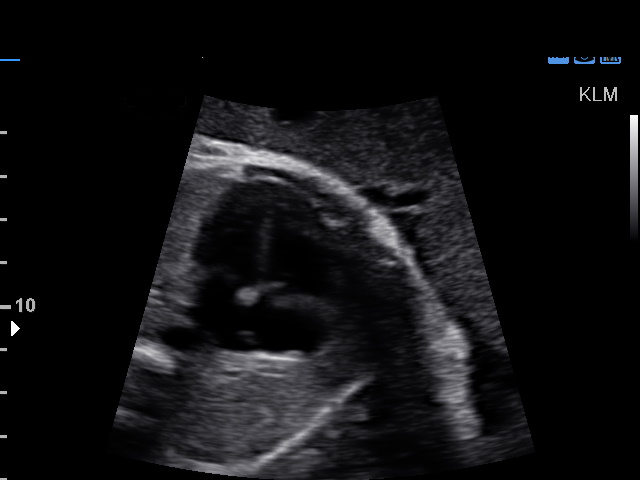
[im 43/56]
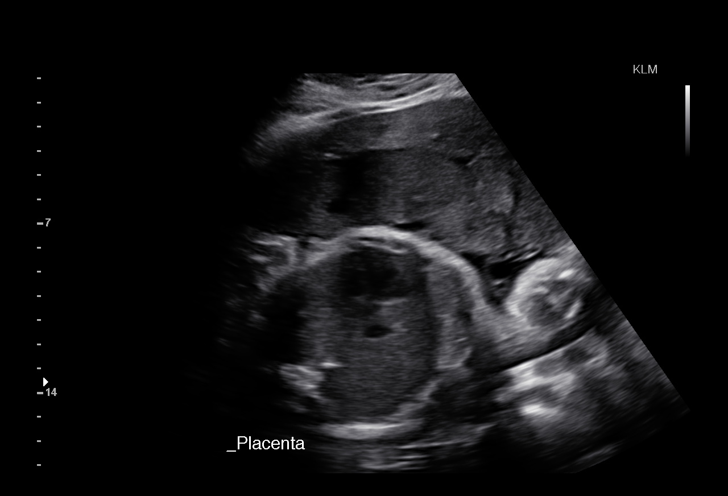
[im 47/56]
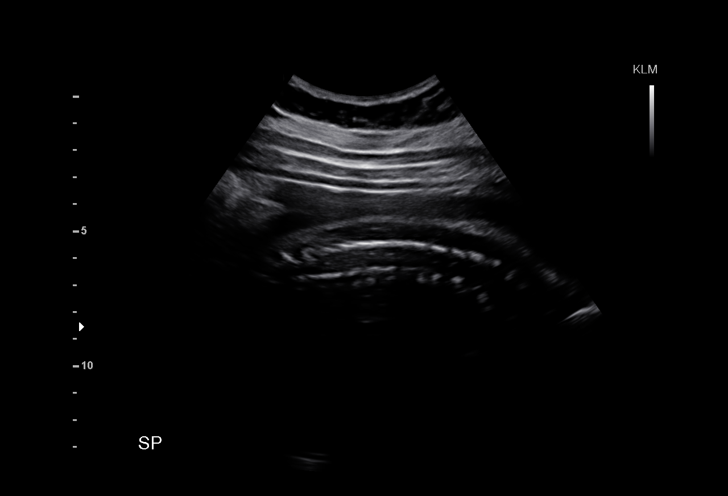
[im 51/56]
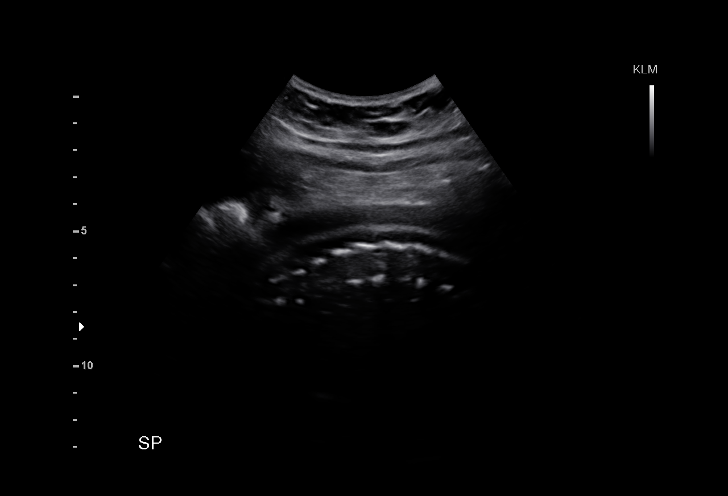
[im 56/56]
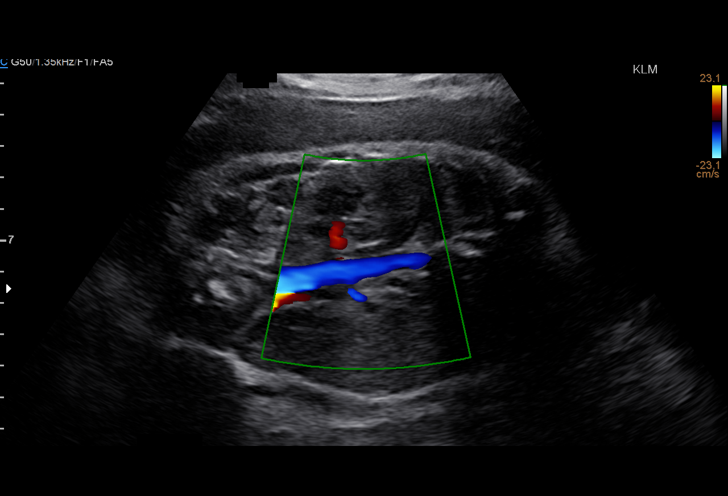

[14 of 28 positions shown; findings below may reference images not displayed]

am)

Name:       LYEDSON BION                      Visit  08/15/2015 [DATE]
Date:

Indications

Follow-up incomplete fetal anatomic             Z36
evaluation
34 weeks gestation of pregnancy
OB History

Gravidity:     2         SAB:   1
Fetal Evaluation

Num Of Fetuses:      1
Fetal Heart          134
Rate(bpm):
Cardiac Activity:    Observed
Presentation:        Cephalic
Placenta:            Anterior, above cervical os
P. Cord Insertion:   Previously Visualized

Amniotic Fluid
AFI FV:      Subjectively within normal limits
AFI Sum:     10.53    cm      23  %Tile     Larg Pckt:    3.47   cm
RUQ:   3.07    cm    RLQ:   3.47    cm   LUQ:    1.45    cm   LLQ:    2.54   cm
Biometry

BPD:      85.4  mm     G. Age:   34w 3d                  CI:        73.14   %    70 - 86
FL/HC:      20.3   %    19.4 -
HC:      317.4  mm     G. Age:   35w 5d        57   %    HC/AC:      1.07        0.96 -
AC:        297  mm     G. Age:   33w 5d        44   %    FL/BPD      75.5   %    71 - 87
:
FL:       64.5  mm     G. Age:   33w 2d        23   %    FL/AC:      21.7   %    20 - 24

Est.        3390   gm    5 lb 1 oz      56   %
FW:
Gestational Age

LMP:           34w 0d        Date:  12/20/14                  EDD:   09/26/15
U/S Today:     34w 2d                                         EDD:   09/24/15
Best:          34w 0d    Det. By:   LMP  (12/20/14)           EDD:   09/26/15
Anatomy

Cranium:          Appears normal         Aortic Arch:       Previously seen
Fetal Cavum:      Appears normal         Ductal Arch:       Previously seen
Ventricles:       Appears normal         Diaphragm:         Appears normal
Choroid Plexus:   Appears normal         Stomach:           Appears normal,
left sided
Cerebellum:       Appears normal         Abdomen:           Appears normal
Posterior         Appears normal         Abdominal          Previously seen
Fossa:                                   Wall:
Nuchal Fold:      Not applicable (>20    Cord Vessels:      Previously seen
wks GA)
Face:             Profile nl; orbits     Kidneys:           Appear normal
previously seen
Lips:             Appears normal         Bladder:           Appears normal
Heart:            Appears normal         Spine:             Appears normal
(4CH, axis, and
situs)
RVOT:             Appears normal         Upper              Previously seen
Extremities:
LVOT:             Appears normal         Lower              Previously seen
Extremities:

Other:   Technically difficult due to advanced GA and fetal position.
Cervix Uterus Adnexa

Cervix
Not visualized (advanced GA >73wks)
Impression

Single IUP at 34w 0d
Normal interval anatomy
Fetal growth is appropriate (56th %tile)
Anterior placenta without previa
Normal amniotic fluid volume
Recommendations

Follow-up ultrasounds as clinically indicated.
